# Patient Record
Sex: Female | Born: 1970 | Race: White | Hispanic: No | Marital: Single | Smoking: Never smoker
Health system: Southern US, Community
[De-identification: ages and names within clinical notes are randomized; demographics above are authoritative.]

## PROBLEM LIST (undated history)

## (undated) ENCOUNTER — Inpatient Hospital Stay (HOSPITAL_COMMUNITY): Payer: Self-pay

## (undated) DIAGNOSIS — I10 Essential (primary) hypertension: Secondary | ICD-10-CM

## (undated) HISTORY — PX: NO PAST SURGERIES: SHX2092

---

## 2015-10-22 NOTE — L&D Delivery Note (Signed)
Delivery Note At 7:16 PM a viable female was delivered via Vaginal, Spontaneous Delivery (Presentation: occiput posterior  ;  ).  APGAR: 7, 9; weight 8 lb 8.5 oz (3870 g).   Placenta status:  Complete , 3 vessel .  Cord:  with the following complications: .Nuchal cord x 1 reduced   Cord pH: Pending  Anesthesia: epidural  Episiotomy: None Lacerations: 2nd degree;Perineal Suture Repair: 3.0 vicryl Est. Blood Loss (mL): 350  Mom to postpartum.  Baby to Couplet care / Skin to Skin.  Datha Kissinger J. 07/04/2016, 7:48 PM

## 2015-11-24 LAB — OB RESULTS CONSOLE ABO/RH: RH TYPE: NEGATIVE

## 2015-11-24 LAB — OB RESULTS CONSOLE RUBELLA ANTIBODY, IGM: RUBELLA: IMMUNE

## 2015-11-24 LAB — OB RESULTS CONSOLE HEPATITIS B SURFACE ANTIGEN: Hepatitis B Surface Ag: NEGATIVE

## 2015-11-24 LAB — OB RESULTS CONSOLE RPR: RPR: NONREACTIVE

## 2015-11-24 LAB — OB RESULTS CONSOLE HIV ANTIBODY (ROUTINE TESTING): HIV: NONREACTIVE

## 2015-12-06 ENCOUNTER — Other Ambulatory Visit (HOSPITAL_COMMUNITY)
Admission: RE | Admit: 2015-12-06 | Discharge: 2015-12-06 | Disposition: A | Discharge status: Home / Self Care | Payer: BLUE CROSS/BLUE SHIELD | Source: Ambulatory Visit | Attending: Obstetrics and Gynecology | Admitting: Obstetrics and Gynecology

## 2015-12-06 DIAGNOSIS — Z1151 Encounter for screening for human papillomavirus (HPV): Secondary | ICD-10-CM | POA: Insufficient documentation

## 2015-12-06 DIAGNOSIS — Z01419 Encounter for gynecological examination (general) (routine) without abnormal findings: Secondary | ICD-10-CM | POA: Insufficient documentation

## 2015-12-06 DIAGNOSIS — Z113 Encounter for screening for infections with a predominantly sexual mode of transmission: Secondary | ICD-10-CM | POA: Insufficient documentation

## 2015-12-06 LAB — OB RESULTS CONSOLE GC/CHLAMYDIA
CHLAMYDIA, DNA PROBE: NEGATIVE
GC PROBE AMP, GENITAL: NEGATIVE

## 2016-04-30 ENCOUNTER — Inpatient Hospital Stay (HOSPITAL_COMMUNITY): Payer: BLUE CROSS/BLUE SHIELD

## 2016-04-30 ENCOUNTER — Inpatient Hospital Stay (HOSPITAL_COMMUNITY)
Admission: AD | Admit: 2016-04-30 | Discharge: 2016-04-30 | Disposition: A | Discharge status: Home / Self Care | Payer: BLUE CROSS/BLUE SHIELD | Source: Ambulatory Visit | Attending: Obstetrics and Gynecology | Admitting: Obstetrics and Gynecology

## 2016-04-30 ENCOUNTER — Encounter (HOSPITAL_COMMUNITY): Payer: Self-pay | Admitting: *Deleted

## 2016-04-30 DIAGNOSIS — O26893 Other specified pregnancy related conditions, third trimester: Secondary | ICD-10-CM | POA: Insufficient documentation

## 2016-04-30 DIAGNOSIS — O09529 Supervision of elderly multigravida, unspecified trimester: Secondary | ICD-10-CM

## 2016-04-30 DIAGNOSIS — Z6791 Unspecified blood type, Rh negative: Secondary | ICD-10-CM | POA: Insufficient documentation

## 2016-04-30 DIAGNOSIS — K219 Gastro-esophageal reflux disease without esophagitis: Secondary | ICD-10-CM | POA: Insufficient documentation

## 2016-04-30 DIAGNOSIS — Z3A29 29 weeks gestation of pregnancy: Secondary | ICD-10-CM | POA: Diagnosis not present

## 2016-04-30 DIAGNOSIS — O99613 Diseases of the digestive system complicating pregnancy, third trimester: Secondary | ICD-10-CM | POA: Diagnosis not present

## 2016-04-30 DIAGNOSIS — O26899 Other specified pregnancy related conditions, unspecified trimester: Secondary | ICD-10-CM | POA: Diagnosis present

## 2016-04-30 DIAGNOSIS — Y92481 Parking lot as the place of occurrence of the external cause: Secondary | ICD-10-CM | POA: Insufficient documentation

## 2016-04-30 DIAGNOSIS — IMO0001 Reserved for inherently not codable concepts without codable children: Secondary | ICD-10-CM | POA: Diagnosis not present

## 2016-04-30 DIAGNOSIS — Z8759 Personal history of other complications of pregnancy, childbirth and the puerperium: Secondary | ICD-10-CM

## 2016-04-30 LAB — CBC
HCT: 29.7 % — ABNORMAL LOW (ref 36.0–46.0)
Hemoglobin: 10.2 g/dL — ABNORMAL LOW (ref 12.0–15.0)
MCH: 29.1 pg (ref 26.0–34.0)
MCHC: 34.3 g/dL (ref 30.0–36.0)
MCV: 84.9 fL (ref 78.0–100.0)
PLATELETS: 244 10*3/uL (ref 150–400)
RBC: 3.5 MIL/uL — AB (ref 3.87–5.11)
RDW: 13.6 % (ref 11.5–15.5)
WBC: 14 10*3/uL — ABNORMAL HIGH (ref 4.0–10.5)

## 2016-04-30 LAB — KLEIHAUER-BETKE STAIN
# VIALS RHIG: 1
FETAL CELLS %: 0 %
QUANTITATION FETAL HEMOGLOBIN: 0 mL

## 2016-04-30 LAB — URINE MICROSCOPIC-ADD ON

## 2016-04-30 LAB — URINALYSIS, ROUTINE W REFLEX MICROSCOPIC
Bilirubin Urine: NEGATIVE
Glucose, UA: NEGATIVE mg/dL
HGB URINE DIPSTICK: NEGATIVE
Ketones, ur: NEGATIVE mg/dL
NITRITE: NEGATIVE
PROTEIN: NEGATIVE mg/dL
pH: 6 (ref 5.0–8.0)

## 2016-04-30 LAB — TYPE AND SCREEN
ABO/RH(D): AB NEG
Antibody Screen: NEGATIVE

## 2016-04-30 LAB — FIBRINOGEN: FIBRINOGEN: 652 mg/dL — AB (ref 204–475)

## 2016-04-30 LAB — PROTIME-INR
INR: 1.02 (ref 0.00–1.49)
Prothrombin Time: 13.6 seconds (ref 11.6–15.2)

## 2016-04-30 LAB — APTT: APTT: 25 s (ref 24–37)

## 2016-04-30 MED ORDER — RHO D IMMUNE GLOBULIN 1500 UNIT/2ML IJ SOSY
300.0000 ug | PREFILLED_SYRINGE | Freq: Once | INTRAMUSCULAR | Status: AC
  Administered 2016-04-30: 300 ug via INTRAMUSCULAR
  Filled 2016-04-30: qty 2

## 2016-04-30 NOTE — Discharge Instructions (Signed)
Call for any bleeding, decreased fetal movement, leaking of fluid, or any significant pain.  Fetal Movement Counts Performing a fetal movement count is highly recommended in high-risk pregnancies, but it is good for every pregnant woman to do. Your health care provider may ask you to start counting fetal movements at 28 weeks of the pregnancy. Fetal movements often increase:  After eating a full meal.  After physical activity.  After eating or drinking something sweet or cold.  At rest. Pay attention to when you feel the baby is most active. This will help you notice a pattern of your baby's sleep and wake cycles and what factors contribute to an increase in fetal movement. It is important to perform a fetal movement count at the same time each day when your baby is normally most active.  HOW TO COUNT FETAL MOVEMENTS 1. Find a quiet and comfortable area to sit or lie down on your left side. Lying on your left side provides the best blood and oxygen circulation to your baby. 2. Write down the day and time on a sheet of paper or in a journal. 3. Start counting kicks, flutters, swishes, rolls, or jabs in a 2-hour period. You should feel at least 10 movements within 2 hours. 4. If you do not feel 10 movements in 2 hours, wait 2-3 hours and count again. Look for a change in the pattern or not enough counts in 2 hours. SEEK MEDICAL CARE IF:  You feel less than 10 counts in 2 hours, tried twice.  There is no movement in over an hour.  The pattern is changing or taking longer each day to reach 10 counts in 2 hours.  You feel the baby is not moving as he or she usually does.

## 2016-04-30 NOTE — MAU Note (Signed)
Urine in lab 
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
## 2016-05-01 LAB — RH IG WORKUP (INCLUDES ABO/RH)
ABO/RH(D): AB NEG
Fetal Screen: NEGATIVE
GESTATIONAL AGE(WKS): 29
Unit division: 0

## 2016-06-18 LAB — OB RESULTS CONSOLE GBS: GBS: POSITIVE

## 2016-07-02 ENCOUNTER — Inpatient Hospital Stay (HOSPITAL_COMMUNITY)
Admission: AD | Admit: 2016-07-02 | Discharge: 2016-07-08 | DRG: 775 | Disposition: A | Discharge status: Home / Self Care | Payer: BLUE CROSS/BLUE SHIELD | Source: Ambulatory Visit | Attending: Obstetrics and Gynecology | Admitting: Obstetrics and Gynecology

## 2016-07-02 ENCOUNTER — Encounter (HOSPITAL_COMMUNITY): Payer: Self-pay | Admitting: *Deleted

## 2016-07-02 DIAGNOSIS — O9902 Anemia complicating childbirth: Secondary | ICD-10-CM | POA: Diagnosis present

## 2016-07-02 DIAGNOSIS — Z3A38 38 weeks gestation of pregnancy: Secondary | ICD-10-CM | POA: Diagnosis not present

## 2016-07-02 DIAGNOSIS — E669 Obesity, unspecified: Secondary | ICD-10-CM | POA: Diagnosis present

## 2016-07-02 DIAGNOSIS — O139 Gestational [pregnancy-induced] hypertension without significant proteinuria, unspecified trimester: Secondary | ICD-10-CM | POA: Diagnosis present

## 2016-07-02 DIAGNOSIS — O1494 Unspecified pre-eclampsia, complicating childbirth: Secondary | ICD-10-CM | POA: Diagnosis present

## 2016-07-02 DIAGNOSIS — Z6791 Unspecified blood type, Rh negative: Secondary | ICD-10-CM | POA: Diagnosis not present

## 2016-07-02 DIAGNOSIS — O99214 Obesity complicating childbirth: Secondary | ICD-10-CM | POA: Diagnosis present

## 2016-07-02 DIAGNOSIS — Z6838 Body mass index (BMI) 38.0-38.9, adult: Secondary | ICD-10-CM

## 2016-07-02 DIAGNOSIS — D649 Anemia, unspecified: Secondary | ICD-10-CM | POA: Diagnosis present

## 2016-07-02 DIAGNOSIS — O26893 Other specified pregnancy related conditions, third trimester: Secondary | ICD-10-CM | POA: Diagnosis present

## 2016-07-02 DIAGNOSIS — O3663X0 Maternal care for excessive fetal growth, third trimester, not applicable or unspecified: Secondary | ICD-10-CM | POA: Diagnosis present

## 2016-07-02 DIAGNOSIS — O99824 Streptococcus B carrier state complicating childbirth: Secondary | ICD-10-CM | POA: Diagnosis present

## 2016-07-02 DIAGNOSIS — O134 Gestational [pregnancy-induced] hypertension without significant proteinuria, complicating childbirth: Secondary | ICD-10-CM | POA: Diagnosis present

## 2016-07-02 DIAGNOSIS — O1490 Unspecified pre-eclampsia, unspecified trimester: Secondary | ICD-10-CM | POA: Diagnosis present

## 2016-07-02 HISTORY — DX: Essential (primary) hypertension: I10

## 2016-07-02 LAB — COMPREHENSIVE METABOLIC PANEL
ALBUMIN: 2.7 g/dL — AB (ref 3.5–5.0)
ALT: 13 U/L — AB (ref 14–54)
AST: 19 U/L (ref 15–41)
Alkaline Phosphatase: 97 U/L (ref 38–126)
Anion gap: 8 (ref 5–15)
BILIRUBIN TOTAL: 0.4 mg/dL (ref 0.3–1.2)
BUN: 8 mg/dL (ref 6–20)
CO2: 20 mmol/L — ABNORMAL LOW (ref 22–32)
CREATININE: 0.63 mg/dL (ref 0.44–1.00)
Calcium: 9 mg/dL (ref 8.9–10.3)
Chloride: 108 mmol/L (ref 101–111)
GFR calc Af Amer: >60 mL/min (ref >60)
GLUCOSE: 113 mg/dL — AB (ref 65–99)
POTASSIUM: 3.4 mmol/L — AB (ref 3.5–5.1)
Sodium: 136 mmol/L (ref 135–145)
TOTAL PROTEIN: 6.4 g/dL — AB (ref 6.5–8.1)

## 2016-07-02 LAB — URIC ACID: Uric Acid, Serum: 3.7 mg/dL (ref 2.3–6.6)

## 2016-07-02 LAB — PROTEIN / CREATININE RATIO, URINE
Creatinine, Urine: 64 mg/dL
PROTEIN CREATININE RATIO: 0.19 mg/mg{creat} — AB (ref 0.00–0.15)
Total Protein, Urine: 12 mg/dL

## 2016-07-02 LAB — TYPE AND SCREEN
ABO/RH(D): AB NEG
Antibody Screen: NEGATIVE

## 2016-07-02 LAB — CBC
HEMATOCRIT: 29.2 % — AB (ref 36.0–46.0)
HEMOGLOBIN: 9.9 g/dL — AB (ref 12.0–15.0)
MCH: 28.8 pg (ref 26.0–34.0)
MCHC: 33.9 g/dL (ref 30.0–36.0)
MCV: 84.9 fL (ref 78.0–100.0)
Platelets: 235 10*3/uL (ref 150–400)
RBC: 3.44 MIL/uL — AB (ref 3.87–5.11)
RDW: 14.5 % (ref 11.5–15.5)
WBC: 12.4 10*3/uL — AB (ref 4.0–10.5)

## 2016-07-02 LAB — LACTATE DEHYDROGENASE: LDH: 142 U/L (ref 98–192)

## 2016-07-02 MED ORDER — ZOLPIDEM TARTRATE 5 MG PO TABS: 5.0000 mg | ORAL_TABLET | Freq: Every evening | ORAL | Status: DC | PRN

## 2016-07-02 MED ORDER — PANTOPRAZOLE SODIUM 40 MG PO TBEC
40.0000 mg | DELAYED_RELEASE_TABLET | Freq: Every day | ORAL | Status: DC
  Administered 2016-07-03 – 2016-07-04 (×2): 40 mg via ORAL
  Filled 2016-07-02 (×2): qty 1

## 2016-07-02 MED ORDER — OXYCODONE-ACETAMINOPHEN 5-325 MG PO TABS: 1.0000 | ORAL_TABLET | ORAL | Status: DC | PRN

## 2016-07-02 MED ORDER — LACTATED RINGERS IV SOLN: 500.0000 mL | INTRAVENOUS | Status: DC | PRN

## 2016-07-02 MED ORDER — ACETAMINOPHEN 325 MG PO TABS
650.0000 mg | ORAL_TABLET | ORAL | Status: DC | PRN
  Administered 2016-07-03 – 2016-07-04 (×2): 650 mg via ORAL
  Filled 2016-07-02 (×2): qty 2

## 2016-07-02 MED ORDER — LABETALOL HCL 5 MG/ML IV SOLN
20.0000 mg | INTRAVENOUS | Status: AC | PRN
  Administered 2016-07-03 (×3): 20 mg via INTRAVENOUS
  Filled 2016-07-02 (×3): qty 4

## 2016-07-02 MED ORDER — LACTATED RINGERS IV SOLN
INTRAVENOUS | Status: DC
  Administered 2016-07-02 – 2016-07-04 (×4): via INTRAVENOUS

## 2016-07-02 MED ORDER — MISOPROSTOL 25 MCG QUARTER TABLET
25.0000 ug | ORAL_TABLET | ORAL | Status: DC | PRN
  Administered 2016-07-02 (×2): 25 ug via VAGINAL
  Filled 2016-07-02 (×2): qty 0.25
  Filled 2016-07-02: qty 1

## 2016-07-02 MED ORDER — OXYTOCIN 40 UNITS IN LACTATED RINGERS INFUSION - SIMPLE MED
2.5000 [IU]/h | INTRAVENOUS | Status: DC
Start: 2016-07-02 — End: 2016-07-04
  Administered 2016-07-04: 2.5 [IU]/h via INTRAVENOUS
  Filled 2016-07-02 (×2): qty 1000

## 2016-07-02 MED ORDER — OXYCODONE-ACETAMINOPHEN 5-325 MG PO TABS: 2.0000 | ORAL_TABLET | ORAL | Status: DC | PRN

## 2016-07-02 MED ORDER — PENICILLIN G POTASSIUM 5000000 UNITS IJ SOLR
2.5000 10*6.[IU] | INTRAVENOUS | Status: DC
  Administered 2016-07-02 – 2016-07-04 (×11): 2.5 10*6.[IU] via INTRAVENOUS
  Filled 2016-07-02 (×14): qty 2.5

## 2016-07-02 MED ORDER — HYDRALAZINE HCL 20 MG/ML IJ SOLN: 10.0000 mg | Freq: Once | INTRAMUSCULAR | Status: DC | PRN

## 2016-07-02 MED ORDER — FENTANYL CITRATE (PF) 100 MCG/2ML IJ SOLN
50.0000 ug | INTRAMUSCULAR | Status: DC | PRN
  Administered 2016-07-03: 100 ug via INTRAVENOUS
  Filled 2016-07-02: qty 2

## 2016-07-02 MED ORDER — OXYTOCIN BOLUS FROM INFUSION
500.0000 mL | Freq: Once | INTRAVENOUS | Status: AC
  Administered 2016-07-04: 500 mL via INTRAVENOUS

## 2016-07-02 MED ORDER — TERBUTALINE SULFATE 1 MG/ML IJ SOLN
0.2500 mg | Freq: Once | INTRAMUSCULAR | Status: DC | PRN
  Filled 2016-07-02: qty 1

## 2016-07-02 MED ORDER — ONDANSETRON HCL 4 MG/2ML IJ SOLN
4.0000 mg | Freq: Four times a day (QID) | INTRAMUSCULAR | Status: DC | PRN
  Administered 2016-07-03: 4 mg via INTRAVENOUS
  Filled 2016-07-02: qty 2

## 2016-07-02 MED ORDER — LIDOCAINE HCL (PF) 1 % IJ SOLN
30.0000 mL | INTRAMUSCULAR | Status: DC | PRN
  Filled 2016-07-02: qty 30

## 2016-07-02 MED ORDER — HYDROXYZINE HCL 50 MG PO TABS
50.0000 mg | ORAL_TABLET | Freq: Four times a day (QID) | ORAL | Status: DC | PRN
  Filled 2016-07-02: qty 1

## 2016-07-02 MED ORDER — SOD CITRATE-CITRIC ACID 500-334 MG/5ML PO SOLN: 30.0000 mL | ORAL | Status: DC | PRN

## 2016-07-02 MED ORDER — PENICILLIN G POTASSIUM 5000000 UNITS IJ SOLR
5.0000 10*6.[IU] | Freq: Once | INTRAMUSCULAR | Status: AC
  Administered 2016-07-02: 5 10*6.[IU] via INTRAVENOUS
  Filled 2016-07-02: qty 5

## 2016-07-02 NOTE — Progress Notes (Signed)
Subjective: Here for induction due to gestational hypertension.  Denies HA, visual sx, or epigastric pain.  Aware of plan for me to manage her during the night for Dr. Richardson Doppole, and accepting of it.  Objective: BP (!) 163/91   Pulse 85   Resp 18   Ht 5\' 7"  (1.702 m)   Wt 110.7 kg (244 lb)   BMI 38.22 kg/m  No intake/output data recorded. No intake/output data recorded.   Vitals:   07/02/16 1834 07/02/16 1903  BP:  (!) 163/91  Pulse:  85  Resp:  18  Weight: 110.7 kg (244 lb)   Height: 5\' 7"  (1.702 m)    Initial PCN dose for GBS infusing.  FHT: Category 1 UC:   Occasional, mild SVE:   Dilation: Closed Effacement (%): Thick Station: Ballotable Exam by:: Davis,RN at 7pm Cytotech 25 mcg #1 dose placed by RN  Results for orders placed or performed during the hospital encounter of 07/02/16 (from the past 24 hour(s))  Type and screen [facility]     Status: None   Collection Time: 07/02/16  6:45 PM  Result Value Ref Range   ABO/RH(D) AB NEG    Antibody Screen NEG    Sample Expiration 07/05/2016   CBC     Status: Abnormal   Collection Time: 07/02/16  6:45 PM  Result Value Ref Range   WBC 12.4 (H) 4.0 - 10.5 K/uL   RBC 3.44 (L) 3.87 - 5.11 MIL/uL   Hemoglobin 9.9 (L) 12.0 - 15.0 g/dL   HCT 53.629.2 (L) 64.436.0 - 03.446.0 %   MCV 84.9 78.0 - 100.0 fL   MCH 28.8 26.0 - 34.0 pg   MCHC 33.9 30.0 - 36.0 g/dL   RDW 74.214.5 59.511.5 - 63.815.5 %   Platelets 235 150 - 400 K/uL  Comprehensive metabolic panel     Status: Abnormal   Collection Time: 07/02/16  6:45 PM  Result Value Ref Range   Sodium 136 135 - 145 mmol/L   Potassium 3.4 (L) 3.5 - 5.1 mmol/L   Chloride 108 101 - 111 mmol/L   CO2 20 (L) 22 - 32 mmol/L   Glucose, Bld 113 (H) 65 - 99 mg/dL   BUN 8 6 - 20 mg/dL   Creatinine, Ser 7.560.63 0.44 - 1.00 mg/dL   Calcium 9.0 8.9 - 43.310.3 mg/dL   Total Protein 6.4 (L) 6.5 - 8.1 g/dL   Albumin 2.7 (L) 3.5 - 5.0 g/dL   AST 19 15 - 41 U/L   ALT 13 (L) 14 - 54 U/L   Alkaline  Phosphatase 97 38 - 126 U/L   Total Bilirubin 0.4 0.3 - 1.2 mg/dL   GFR calc non Af Amer >60 >60 mL/min   GFR calc Af Amer >60 >60 mL/min   Anion gap 8 5 - 15  Lactate dehydrogenase     Status: None   Collection Time: 07/02/16  6:45 PM  Result Value Ref Range   LDH 142 98 - 192 U/L  Uric acid     Status: None   Collection Time: 07/02/16  6:45 PM  Result Value Ref Range   Uric Acid, Serum 3.7 2.3 - 6.6 mg/dL  Protein / creatinine ratio, urine     Status: Abnormal   Collection Time: 07/02/16  6:55 PM  Result Value Ref Range   Creatinine, Urine 64.00 mg/dL   Total Protein, Urine 12 mg/dL   Protein Creatinine Ratio 0.19 (H) 0.00 - 0.15 mg/mg[Cre]  Assessment:  IUP at 38 week--induction for gestational hypertension Rh negative GBS positive LGA fetus, proven to 8+6 Does not plan epidural  Plan: Will be observing patient for Dr. Richardson Dopp overnight.  Patient is agreeable with plan. Continue Cytotech for cervical ripening. OK for light meal. IV antihypertensives for BP parameters.  [name] CNM 07/02/2016, 7:46 PM

## 2016-07-02 NOTE — Anesthesia Pain Management Evaluation Note (Signed)
  CRNA Pain Management Visit Note  Patient: Elwyn LadeChristine Mcilrath, 45 y.o., female  "Hello [facility] am a [facility]. We have an anesthesia team available at all times to provide care throughout the hospital, including epidural management and anesthesia for C-section. I don't know your plan for the delivery whether it a natural birth, water birth, IV sedation, nitrous supplementation, doula or epidural, but we want to meet your pain goals."   1.Was your pain managed to your expectations on prior hospitalizations?   Yes   2.What is your expectation for pain management during this hospitalization?     Labor support without medications  3.How can we help you reach that goal? Standby  Record the patient's initial score and the patient's pain goal.   Pain: 0  Pain Goal: 10 [facility] wants you to be able to say your pain was always managed very well.  Meridee Branum 07/02/2016

## 2016-07-02 NOTE — H&P (Signed)
Carmen Baker is a 45 y.o.  G3P2002 at 38 wks and 0 days based on LMP 10/10/2015 confirmed by 8 wk ultrasound with EDD 07/16/2016. Pregnancy has been complicated by Advanced maternal Age. Prenatal care provided by Dr. Gerald Leitz with [facility]. Pt presented to the office today for routine visit and bp was 157/94 repeat was 158/99. Trace protein on urine dip. She denies headache, visual disturbances or ruq pain. Cervix is 1/50/-3 on exam. No ctx/ no vaginal bleeding no Leakage of fluid. She is admitted for induction due to gestational hypertension.   EFW on ultrasound today in the office 07/02/2016 is 8 lbs 7 oz.    OB History    Gravida Para Term Preterm AB Living   3         2   SAB TAB Ectopic Multiple Live Births           2      PMH None  PSH None   Family History noncontributory.   Social History:  reports that she has never smoked. She does not have any smokeless tobacco history on file. She reports that she does not drink alcohol or use drugs.     Maternal Diabetes: No Genetic Screening: Normal Maternal Ultrasounds/Referrals: Normal Fetal Ultrasounds or other Referrals:  None Maternal Substance Abuse:  No Significant Maternal Medications:  None Significant Maternal Lab Results:  Lab values include: Group B Strep positive Other Comments:  None  Review of Systems  Constitutional: Negative.   Eyes: Negative for blurred vision, double vision and photophobia.  Respiratory: Negative.   Cardiovascular: Negative.   Gastrointestinal: Negative.   Genitourinary: Negative.   Musculoskeletal: Negative.   Skin: Positive for rash.  Neurological: Negative.  Negative for headaches.  Endo/Heme/Allergies: Negative.   Psychiatric/Behavioral: Negative.    Maternal Medical History:  Fetal activity: Perceived fetal activity is normal.   Last perceived fetal movement was within the past hour.        There were no vitals taken for this visit. Maternal Exam:  Abdomen: Patient  reports no abdominal tenderness. Estimated fetal weight is 8 lbs 7oz on ultrasound today 07/02/2016.. in the office.   Fetal presentation: vertex  Introitus: Normal vulva. Vagina is positive for cysts.  Vagina is negative for discharge.  Pelvis: adequate for delivery.   Cervix: Cervix evaluated by digital exam.     Fetal Exam Fetal Monitor Review: Baseline rate: 140 reactive NST in the office .  Variability: moderate (6-25 bpm).   Pattern: accelerations present and no decelerations.    Fetal State Assessment: Category I - tracings are normal.     Physical Exam  Constitutional: She is oriented to person, place, and time. She appears well-developed and well-nourished.  HENT:  Head: Normocephalic and atraumatic.  Eyes: Pupils are equal, round, and reactive to light.  Neck: Normal range of motion.  Cardiovascular: Normal rate and regular rhythm.   Respiratory: Effort normal and breath sounds normal.  GI: Bowel sounds are normal. There is no tenderness.  Genitourinary: Uterus normal. No vaginal discharge found.  Genitourinary Comments: 4 cm Right vaginal cyst   Musculoskeletal: Normal range of motion. She exhibits edema.  Neurological: She is alert and oriented to person, place, and time. She has normal reflexes.  Skin: Skin is warm and dry.  Psychiatric: She has a normal mood and affect.    Prenatal labs: ABO, Rh: --/--/AB NEG, AB NEG (07/11 1828) Antibody: NEG (07/11 1828) Rubella:   Immune  RPR:  Nonreactive  HBsAg:  Negative   HIV:   Negative  GBS:   Positive   Assessment/Plan: 38 wks and 0 days with gestational hypertension admit for cytotec induction Labs to rule out Preeclampsia. Ordered GBS positive- penicillin LGA fetus. Pelvis proven to 8 lbs 6 oz  .Marland Kitchen. Anticipate SVD    Carmen Baker J. 07/02/2016, 6:03 PM

## 2016-07-03 ENCOUNTER — Inpatient Hospital Stay (HOSPITAL_COMMUNITY): Payer: BLUE CROSS/BLUE SHIELD | Admitting: Anesthesiology

## 2016-07-03 LAB — CBC
HCT: 31.4 % — ABNORMAL LOW (ref 36.0–46.0)
HEMOGLOBIN: 10.7 g/dL — AB (ref 12.0–15.0)
MCH: 29 pg (ref 26.0–34.0)
MCHC: 34.1 g/dL (ref 30.0–36.0)
MCV: 85.1 fL (ref 78.0–100.0)
Platelets: 245 10*3/uL (ref 150–400)
RBC: 3.69 MIL/uL — AB (ref 3.87–5.11)
RDW: 14.3 % (ref 11.5–15.5)
WBC: 17.8 10*3/uL — ABNORMAL HIGH (ref 4.0–10.5)

## 2016-07-03 LAB — RPR: RPR Ser Ql: NONREACTIVE

## 2016-07-03 MED ORDER — EPHEDRINE 5 MG/ML INJ
10.0000 mg | INTRAVENOUS | Status: DC | PRN
  Filled 2016-07-03: qty 4

## 2016-07-03 MED ORDER — PHENYLEPHRINE 40 MCG/ML (10ML) SYRINGE FOR IV PUSH (FOR BLOOD PRESSURE SUPPORT)
80.0000 ug | PREFILLED_SYRINGE | INTRAVENOUS | Status: DC | PRN
  Filled 2016-07-03: qty 5
  Filled 2016-07-03: qty 10

## 2016-07-03 MED ORDER — OXYTOCIN 40 UNITS IN LACTATED RINGERS INFUSION - SIMPLE MED
1.0000 m[IU]/min | INTRAVENOUS | Status: DC
  Administered 2016-07-03 – 2016-07-04 (×2): 1 m[IU]/min via INTRAVENOUS

## 2016-07-03 MED ORDER — TRIAMCINOLONE ACETONIDE 0.5 % EX CREA
TOPICAL_CREAM | Freq: Two times a day (BID) | CUTANEOUS | Status: DC | PRN
Start: 2016-07-03 — End: 2016-07-04
  Administered 2016-07-03: 1 via TOPICAL

## 2016-07-03 MED ORDER — LIDOCAINE HCL (PF) 1 % IJ SOLN
INTRAMUSCULAR | Status: DC | PRN
  Administered 2016-07-03 (×2): 4 mL

## 2016-07-03 MED ORDER — FENTANYL 2.5 MCG/ML BUPIVACAINE 1/10 % EPIDURAL INFUSION (WH - ANES)
14.0000 mL/h | INTRAMUSCULAR | Status: DC | PRN
  Administered 2016-07-03 – 2016-07-04 (×3): 14 mL/h via EPIDURAL
  Filled 2016-07-03 (×3): qty 125

## 2016-07-03 MED ORDER — PHENYLEPHRINE 40 MCG/ML (10ML) SYRINGE FOR IV PUSH (FOR BLOOD PRESSURE SUPPORT)
80.0000 ug | PREFILLED_SYRINGE | INTRAVENOUS | Status: DC | PRN
  Filled 2016-07-03: qty 5

## 2016-07-03 MED ORDER — DIPHENHYDRAMINE HCL 50 MG/ML IJ SOLN
12.5000 mg | INTRAMUSCULAR | Status: DC | PRN
  Administered 2016-07-04: 12.5 mg via INTRAVENOUS
  Filled 2016-07-03: qty 1

## 2016-07-03 MED ORDER — LACTATED RINGERS IV SOLN: 500.0000 mL | Freq: Once | INTRAVENOUS | Status: DC

## 2016-07-03 NOTE — Care Management (Signed)
Requested charge nurse to have another nurse care for patient while in delivery of another patient from 0840-1200 07/03/16.

## 2016-07-03 NOTE — Progress Notes (Signed)
Carmen Baker is a 45 y.o. G3P2002 at 691w1d  admitted for induction of labor due to gestational hypertension.  Subjective: Pt reports contractions are more painful. Feels pressure with contractions  Objective: BP 136/62   Pulse 74   Temp 98.3 F (36.8 C) (Oral)   Resp 20   Ht 5\' 7"  (1.702 m)   Wt 244 lb (110.7 kg)   BMI 38.22 kg/m  No intake/output data recorded. No intake/output data recorded.  FHT:  FHR: 130 bpm, variability: moderate,  accelerations:  Present,  decelerations:  Absent UC:   regular, every 2 minutes SVE:   Dilation: 4 Effacement (%): 50 Station: -3 Exam by:: Richardson Doppole, MD  Labs: Lab Results  Component Value Date   WBC 12.4 (H) 07/02/2016   HGB 9.9 (L) 07/02/2016   HCT 29.2 (L) 07/02/2016   MCV 84.9 07/02/2016   PLT 235 07/02/2016    Assessment / Plan: induction of labor due to gestational hypertension   Labor: Progressing on Pitocin, will continue to increase then AROM Preeclampsia:  NA Fetal Wellbeing:  Category I Pain Control:  Labor support without medications I/D:  Penicillin Anticipated MOD:  NSVD  Dr. Charlotta Newtonzan covering now  And until 7 am in the morning   Carmen Baker. 07/03/2016, 6:12 PM

## 2016-07-03 NOTE — Progress Notes (Signed)
  Subjective: Aware of more cramping, 3/10 intensity.  Denies need for pain med.  Objective: BP (!) 155/87   Pulse 76   Temp 98.3 F (36.8 C) (Oral)   Resp 20   Ht 5\' 7"  (1.702 m)   Wt 110.7 kg (244 lb)   BMI 38.22 kg/m  No intake/output data recorded. No intake/output data recorded.   Vitals:   07/03/16 0000 07/03/16 0100 07/03/16 0155 07/03/16 0324  BP: (!) 147/74   (!) 155/87  Pulse: 73   76  Resp:  20 18 20   Temp:      TempSrc:      Weight:      Height:        FHT: Category 1--occasional mild variables, moderate variability UC:   irregular, every 2-6 minutes SVE:   Dilation: 1.5 Effacement (%): 60 Station: -3 Exam by:: Corey Skainsatherine stout RN Received 2 doses Cytotech, last at 2321  Assessment:  Induction for gestational hypertension GBS positive Anemia--Hgb 9.9  Plan: Start pitocin per low dose protocol, increase to 4 mu/min, and hold there.  Nigel BridgemanLATHAM, Catrice Zuleta CNM 07/03/2016, 3:35 AM

## 2016-07-03 NOTE — Anesthesia Preprocedure Evaluation (Signed)
Anesthesia Evaluation  Patient identified by MRN, date of birth, ID band Patient awake    Reviewed: Allergy & Precautions, NPO status , Patient's Chart, lab work & pertinent test results  History of Anesthesia Complications Negative for: history of anesthetic complications  Airway Mallampati: II  TM Distance: >3 FB Neck ROM: Full    Dental no notable dental hx. (+) Dental Advisory Given   Pulmonary neg pulmonary ROS,    Pulmonary exam normal breath sounds clear to auscultation       Cardiovascular hypertension, Normal cardiovascular exam Rhythm:Regular Rate:Normal     Neuro/Psych negative neurological ROS  negative psych ROS   GI/Hepatic negative GI ROS, Neg liver ROS,   Endo/Other  obesity  Renal/GU negative Renal ROS  negative genitourinary   Musculoskeletal negative musculoskeletal ROS (+)   Abdominal   Peds negative pediatric ROS (+)  Hematology negative hematology ROS (+)   Anesthesia Other Findings   Reproductive/Obstetrics (+) Pregnancy                            Anesthesia Physical Anesthesia Plan  ASA: II  Anesthesia Plan: Epidural   Post-op Pain Management:    Induction:   Airway Management Planned:   Additional Equipment:   Intra-op Plan:   Post-operative Plan:   Informed Consent: I have reviewed the patients History and Physical, chart, labs and discussed the procedure including the risks, benefits and alternatives for the proposed anesthesia with the patient or authorized representative who has indicated his/her understanding and acceptance.   Dental advisory given  Plan Discussed with: CRNA  Anesthesia Plan Comments:         Anesthesia Quick Evaluation  

## 2016-07-03 NOTE — Anesthesia Procedure Notes (Signed)
Epidural Patient location during procedure: OB  Staffing Anesthesiologist: Haris Baack Performed: anesthesiologist   Preanesthetic Checklist Completed: patient identified, site marked, surgical consent, pre-op evaluation, timeout performed, IV checked, risks and benefits discussed and monitors and equipment checked  Epidural Patient position: sitting Prep: site prepped and draped and DuraPrep Patient monitoring: continuous pulse ox and blood pressure Approach: midline Location: L3-L4 Injection technique: LOR saline  Needle:  Needle type: Tuohy  Needle gauge: 17 G Needle length: 9 cm and 9 Needle insertion depth: 5 cm cm Catheter type: closed end flexible Catheter size: 19 Gauge Catheter at skin depth: 10 cm Test dose: negative  Assessment Events: blood not aspirated, injection not painful, no injection resistance, negative IV test and no paresthesia  Additional Notes Patient identified. Risks/Benefits/Options discussed with patient including but not limited to bleeding, infection, nerve damage, paralysis, failed block, incomplete pain control, headache, blood pressure changes, nausea, vomiting, reactions to medication both or allergic, itching and postpartum back pain. Confirmed with bedside nurse the patient's most recent platelet count. Confirmed with patient that they are not currently taking any anticoagulation, have any bleeding history or any family history of bleeding disorders. Patient expressed understanding and wished to proceed. All questions were answered. Sterile technique was used throughout the entire procedure. Please see nursing notes for vital signs. Test dose was given through epidural catheter and negative prior to continuing to dose epidural or start infusion. Warning signs of high block given to the patient including shortness of breath, tingling/numbness in hands, complete motor block, or any concerning symptoms with instructions to call for help. Patient was  given instructions on fall risk and not to get out of bed. All questions and concerns addressed with instructions to call with any issues or inadequate analgesia.        

## 2016-07-03 NOTE — Progress Notes (Signed)
Elwyn LadeChristine Seliga is a 45 y.o. G3P2002 at 1823w1d  admitted for induction of labor due to gestational hypertension.  Subjective: Pt rates contractions 3 out 10.   Objective: BP 128/66   Pulse 76   Temp 98.3 F (36.8 C) (Oral)   Resp 20   Ht 5\' 7"  (1.702 m)   Wt 244 lb (110.7 kg)   BMI 38.22 kg/m  No intake/output data recorded. No intake/output data recorded.  FHT:  Category 1 UC:   regular, every 3 minutes SVE:   Dilation: 1.5 Effacement (%): 50 Station: -3 Exam by:: Richardson Doppole, MD  Labs: Lab Results  Component Value Date   WBC 12.4 (H) 07/02/2016   HGB 9.9 (L) 07/02/2016   HCT 29.2 (L) 07/02/2016   MCV 84.9 07/02/2016   PLT 235 07/02/2016    Assessment / Plan: Induction of labor due to gestational hypertension  Labor: plan to increase pitocin 2x2 . Fetal Wellbeing:  Category I Pain Control:  Labor support without medications I/D:  Penicillin Anticipated MOD:  NSVD  Keenan Trefry J. 07/03/2016, 8:32 AM

## 2016-07-04 ENCOUNTER — Encounter (HOSPITAL_COMMUNITY): Payer: Self-pay | Admitting: *Deleted

## 2016-07-04 MED ORDER — OXYTOCIN 40 UNITS IN LACTATED RINGERS INFUSION - SIMPLE MED
1.0000 m[IU]/min | INTRAVENOUS | Status: DC
  Administered 2016-07-04: 18 m[IU]/min via INTRAVENOUS

## 2016-07-04 MED ORDER — DIPHENHYDRAMINE HCL 25 MG PO CAPS: 25.0000 mg | ORAL_CAPSULE | Freq: Four times a day (QID) | ORAL | Status: DC | PRN

## 2016-07-04 MED ORDER — MISOPROSTOL 200 MCG PO TABS
ORAL_TABLET | ORAL | Status: AC
  Filled 2016-07-04: qty 5

## 2016-07-04 MED ORDER — DIBUCAINE 1 % RE OINT
1.0000 "application " | TOPICAL_OINTMENT | RECTAL | Status: DC | PRN
  Administered 2016-07-05: 1 via RECTAL
  Filled 2016-07-04: qty 28

## 2016-07-04 MED ORDER — ONDANSETRON HCL 4 MG/2ML IJ SOLN: 4.0000 mg | INTRAMUSCULAR | Status: DC | PRN

## 2016-07-04 MED ORDER — ACETAMINOPHEN 325 MG PO TABS: 650.0000 mg | ORAL_TABLET | ORAL | Status: DC | PRN

## 2016-07-04 MED ORDER — FERROUS SULFATE 325 (65 FE) MG PO TABS
325.0000 mg | ORAL_TABLET | Freq: Two times a day (BID) | ORAL | Status: DC
Start: 2016-07-05 — End: 2016-07-05
  Administered 2016-07-05 (×2): 325 mg via ORAL
  Filled 2016-07-04 (×2): qty 1

## 2016-07-04 MED ORDER — MAGNESIUM HYDROXIDE 400 MG/5ML PO SUSP: 30.0000 mL | ORAL | Status: DC | PRN

## 2016-07-04 MED ORDER — SENNOSIDES-DOCUSATE SODIUM 8.6-50 MG PO TABS
2.0000 | ORAL_TABLET | ORAL | Status: DC
  Administered 2016-07-05: 2 via ORAL
  Filled 2016-07-04: qty 2

## 2016-07-04 MED ORDER — COCONUT OIL OIL: 1.0000 "application " | TOPICAL_OIL | Status: DC | PRN

## 2016-07-04 MED ORDER — SIMETHICONE 80 MG PO CHEW: 80.0000 mg | CHEWABLE_TABLET | ORAL | Status: DC | PRN

## 2016-07-04 MED ORDER — OXYCODONE HCL 5 MG PO TABS: 10.0000 mg | ORAL_TABLET | ORAL | Status: DC | PRN

## 2016-07-04 MED ORDER — ZOLPIDEM TARTRATE 5 MG PO TABS: 5.0000 mg | ORAL_TABLET | Freq: Every evening | ORAL | Status: DC | PRN

## 2016-07-04 MED ORDER — BENZOCAINE-MENTHOL 20-0.5 % EX AERO
1.0000 | INHALATION_SPRAY | CUTANEOUS | Status: DC | PRN
Start: 2016-07-04 — End: 2016-07-05
  Administered 2016-07-05: 1 via TOPICAL
  Filled 2016-07-04: qty 56

## 2016-07-04 MED ORDER — OXYCODONE HCL 5 MG PO TABS
5.0000 mg | ORAL_TABLET | ORAL | Status: DC | PRN
  Administered 2016-07-05: 5 mg via ORAL
  Filled 2016-07-04: qty 1

## 2016-07-04 MED ORDER — ONDANSETRON HCL 4 MG PO TABS: 4.0000 mg | ORAL_TABLET | ORAL | Status: DC | PRN

## 2016-07-04 MED ORDER — PRENATAL MULTIVITAMIN CH
1.0000 | ORAL_TABLET | Freq: Every day | ORAL | Status: DC
  Administered 2016-07-05: 1 via ORAL
  Filled 2016-07-04: qty 1

## 2016-07-04 MED ORDER — IBUPROFEN 600 MG PO TABS
600.0000 mg | ORAL_TABLET | Freq: Four times a day (QID) | ORAL | Status: DC
Start: 2016-07-05 — End: 2016-07-05
  Administered 2016-07-05 (×4): 600 mg via ORAL
  Filled 2016-07-04 (×4): qty 1

## 2016-07-04 MED ORDER — WITCH HAZEL-GLYCERIN EX PADS: 1.0000 "application " | MEDICATED_PAD | CUTANEOUS | Status: DC | PRN

## 2016-07-04 MED ORDER — LACTATED RINGERS IV SOLN
INTRAVENOUS | Status: DC
  Administered 2016-07-04: 13:00:00 via INTRAUTERINE

## 2016-07-04 NOTE — Progress Notes (Signed)
Elwyn LadeChristine Hink is a 45 y.o. G3P2002 at 1175w2d admitted for induction of labor due to gestational hypertension .  Subjective: Pt is comfortable with her epidural   Objective: BP 136/66   Pulse 88   Temp 98.4 F (36.9 C) (Oral)   Resp 20   Ht 5\' 7"  (1.702 m)   Wt 244 lb (110.7 kg)   SpO2 98%   BMI 38.22 kg/m  No intake/output data recorded. Total I/O In: -  Out: 450 [Urine:450]  FHT:  FHR: 120 bpm, variability: moderate,  accelerations:  Present,  decelerations:  Present variable  UC:   regular, every 2 minutes mvu's only 75-100 SVE:   Dilation: 8 Effacement (%): 80 Station: -2 Exam by:: Herma CarsonLindsay Lima, rn  Labs: Lab Results  Component Value Date   WBC 17.8 (H) 07/03/2016   HGB 10.7 (L) 07/03/2016   HCT 31.4 (L) 07/03/2016   MCV 85.1 07/03/2016   PLT 245 07/03/2016    Assessment / Plan: 38wks and 2 days for induction due to gestational hypertensin FHR category 2 however overall reassuring.  Continue amnioinfusion.  Protracted active phase. MVU's are not adequate plan to continue pitocin and increase to reach adequate mvu's as long as fetal heart rate tracing remains overall reassuring.  GBS + continue pitocin.   Damarious Holtsclaw J. 07/04/2016, 5:39 PM

## 2016-07-04 NOTE — Progress Notes (Signed)
RN assisted pt to bathroom to urinate. Attempt unsuccessful. RN assisted pt back to bed. Nursery RN at bedside. Baby placed skin to skin for shots and meal ordered for pt. After pt eats, will try 2nd attempt to urinate. If unsuccessful RN will bladder scan and In and Out Cath. Pt states understanding and agrees with POC.

## 2016-07-04 NOTE — Progress Notes (Signed)
Dr Richardson Doppcole called to check on pt.  Reviewed strip remotely.  MD notified that pitocin is on 345mu/min.  May continue to increase pitocin as fetal heartrate tolerates

## 2016-07-04 NOTE — Progress Notes (Signed)
No order placed on previous shift for amnioinfusion.  New order placed at this time.

## 2016-07-04 NOTE — Anesthesia Rounding Note (Signed)
  CRNA Epidural Rounding Note  Patient: Carmen Baker, 45 y.o., female  Patient's current pain level: 0  Agreed upon pain management level: 4  Epidural intervention: No   Comments: patient comfortable  [facility],[name] 07/04/2016

## 2016-07-04 NOTE — Progress Notes (Addendum)
Elwyn LadeChristine Federer is a 45 y.o. G3P2002 at 564w2d  admitted for induction of labor due to gestational hypertension.  Subjective: Resting comfortably with epidural  Objective: BP 138/71   Pulse 79   Temp 98.3 F (36.8 C) (Oral)   Resp 18   Ht 5\' 7"  (1.702 m)   Wt 110.7 kg (244 lb)   SpO2 98%   BMI 38.22 kg/m   FHT: 145, moderate variability, +accels, occasional variable decels UC:   regular, every 2 minutes SVE: stretchy 5-6/75/-2, AROM clear fluid, IUPC placed  Labs: Lab Results  Component Value Date   WBC 17.8 (H) 07/03/2016   HGB 10.7 (L) 07/03/2016   HCT 31.4 (L) 07/03/2016   MCV 85.1 07/03/2016   PLT 245 07/03/2016    Assessment / Plan: 45yo [id number] @ [redacted]w[redacted]d who presents for IOL due to gestational HTN -FWB: Cat. II- pt repositioned, will continue to closely monitor.  Overall FHT reassuring -Labor: Expectant management, continue Pit per protocol -Pain management: continue epidural -GBS positive, continue PCN -Gestational HTN: BP improved s/p epidural suspect some elevation due to pain.  Will continue to closely monitor  Myna HidalgoJennifer Rena Sweeden, DO [phone] (pager) [phone] (office)   **ADDENDUM @ 0100:  FSE also placed due to difficulty with FHT tracing and recurrent variable decels x 4.  Amnioinfusion to be started.  FHT now 150, moderate variability, no accels, no decels.  Will continue to closely monitor.  Myna HidalgoJennifer Levenia Skalicky, DO [phone] (pager) [phone] (office)

## 2016-07-04 NOTE — Progress Notes (Signed)
Carmen Baker is a 45 y.o. G3P2002 at 6132w2d  admitted for induction of labor due to gestational hypertension .  Subjective: Pt is comfortable with her epidural.   Objective: BP 139/83   Pulse 94   Temp 98.4 F (36.9 C) (Oral)   Resp 20   Ht 5\' 7"  (1.702 m)   Wt 244 lb (110.7 kg)   SpO2 98%   BMI 38.22 kg/m  No intake/output data recorded. Total I/O In: -  Out: 450 [Urine:450]  FHT:  FHR: 130 bpm, variability: moderate,  accelerations:  Present,  decelerations:  Absent UC:   regular, every 2 minutes SVE:   Dilation: Lip/rim Effacement (%): 80 Station: +1 Exam by:: Dr Richardson Doppcole  Labs: Lab Results  Component Value Date   WBC 17.8 (H) 07/03/2016   HGB 10.7 (L) 07/03/2016   HCT 31.4 (L) 07/03/2016   MCV 85.1 07/03/2016   PLT 245 07/03/2016    Assessment / Plan: 38 wks and 2 days admitted for induction due to gestational hypertension.  Anterior lip+1 station.. Plan to recheck cervix in 30 minutes   Anticipate SVD Category 1 tracing  GBS + continue penicillin   Dayron Odland J. 07/04/2016, 5:43 PM

## 2016-07-04 NOTE — Progress Notes (Signed)
Carmen Baker is a 45 y.o. G3P2002 at 6539w2d  admitted for induction of labor due to gestational hypertension .  Subjective: Patient is comfortable with epidural   Objective: BP (!) 141/75   Pulse 70   Temp 98.3 F (36.8 C) (Oral)   Resp 18   Ht 5\' 7"  (1.702 m)   Wt 244 lb (110.7 kg)   SpO2 98%   BMI 38.22 kg/m  No intake/output data recorded. No intake/output data recorded.  FHT:  FHR: 120 bpm, variability: moderate,  accelerations:  Present,  decelerations:  Present variable  UC:   regular, every 2-3 minutes SVE:   Dilation: 8 Effacement (%): 80 Station: -2 Exam by:: Dr. Richardson Doppole  Labs: Lab Results  Component Value Date   WBC 17.8 (H) 07/03/2016   HGB 10.7 (L) 07/03/2016   HCT 31.4 (L) 07/03/2016   MCV 85.1 07/03/2016   PLT 245 07/03/2016    Assessment / Plan: Protracted active phase Fetal Wellbeing:  Category II pt with repetitive variable's with return to baseline Pain Control:  Epidural I/D:  pencillin Anticipated MOD:  questionable. discussed with patient r/o cesarean section in the event that baby does not tolerate labor, arrest of dilatation or arrest of descent including but not limited to infection bleeding damage to bowel bladder baby with the need for further surgery. She voiced understanding. Will continue to monitor closely for now.  Carmen Baker J. 07/04/2016, 7:33 AM

## 2016-07-04 NOTE — Progress Notes (Signed)
Carmen Baker is a 45 y.o. G3P2002 at 729w2d  admitted for induction of labor due to gestational hypertension.  Subjective: Resting comfortably with epidural  Objective: BP 134/74   Pulse 78   Temp 98.3 F (36.8 C) (Oral)   Resp 18   Ht 5\' 7"  (1.702 m)   Wt 110.7 kg (244 lb)   SpO2 98%   BMI 38.22 kg/m   FHT: 145, moderate variability, +accels, occasional variable decels UC:   q3-685min, inadeaquate MVU SVE: deferred, IUPC/FSE in place, amnioinfusion running  Labs: Lab Results  Component Value Date   WBC 17.8 (H) 07/03/2016   HGB 10.7 (L) 07/03/2016   HCT 31.4 (L) 07/03/2016   MCV 85.1 07/03/2016   PLT 245 07/03/2016    Assessment / Plan: 44yo [id number] @ 2129w2d who presents for IOL due to gestational HTN -FWB: Cat. II- internal monitors and amninoinfusion in place.  Overall FHT reassuring, will continue to closely monitor -Labor: Expectant management,overnight Pit had been discontinued, now restarted working on adequate MVUs -Pain management: continue epidural -GBS positive, continue PCN -Gestational HTN: BP stable, no further IV medication needed overnight.  Will continue to closely monitor  Myna HidalgoJennifer Vernon Maish, DO [phone]) [phone] (office)

## 2016-07-05 LAB — CBC
HCT: 27.7 % — ABNORMAL LOW (ref 36.0–46.0)
HEMATOCRIT: 27.6 % — AB (ref 36.0–46.0)
HEMOGLOBIN: 9.5 g/dL — AB (ref 12.0–15.0)
HEMOGLOBIN: 9.5 g/dL — AB (ref 12.0–15.0)
MCH: 29.1 pg (ref 26.0–34.0)
MCH: 29.3 pg (ref 26.0–34.0)
MCHC: 34.3 g/dL (ref 30.0–36.0)
MCHC: 34.4 g/dL (ref 30.0–36.0)
MCV: 85 fL (ref 78.0–100.0)
MCV: 85.2 fL (ref 78.0–100.0)
PLATELETS: 212 10*3/uL (ref 150–400)
Platelets: 215 10*3/uL (ref 150–400)
RBC: 3.26 MIL/uL — AB (ref 3.87–5.11)
RBC: 3.24 MIL/uL — AB (ref 3.87–5.11)
RDW: 14.6 % (ref 11.5–15.5)
RDW: 14.6 % (ref 11.5–15.5)
WBC: 22 10*3/uL — AB (ref 4.0–10.5)
WBC: 18.8 10*3/uL — AB (ref 4.0–10.5)

## 2016-07-05 LAB — LACTATE DEHYDROGENASE: LDH: 175 U/L (ref 98–192)

## 2016-07-05 LAB — PROTEIN / CREATININE RATIO, URINE
Creatinine, Urine: 55 mg/dL
PROTEIN CREATININE RATIO: 0.27 mg/mg{creat} — AB (ref 0.00–0.15)
TOTAL PROTEIN, URINE: 15 mg/dL

## 2016-07-05 LAB — COMPREHENSIVE METABOLIC PANEL
ALBUMIN: 2.3 g/dL — AB (ref 3.5–5.0)
ALK PHOS: 89 U/L (ref 38–126)
ALT: 12 U/L — AB (ref 14–54)
AST: 24 U/L (ref 15–41)
Anion gap: 6 (ref 5–15)
BILIRUBIN TOTAL: 0.4 mg/dL (ref 0.3–1.2)
BUN: 11 mg/dL (ref 6–20)
CO2: 20 mmol/L — ABNORMAL LOW (ref 22–32)
CREATININE: 1.21 mg/dL — AB (ref 0.44–1.00)
Calcium: 8.8 mg/dL — ABNORMAL LOW (ref 8.9–10.3)
Chloride: 110 mmol/L (ref 101–111)
GFR calc Af Amer: >60 mL/min (ref >60)
GFR calc non Af Amer: 54 mL/min — ABNORMAL LOW (ref >60)
GLUCOSE: 93 mg/dL (ref 65–99)
POTASSIUM: 3.3 mmol/L — AB (ref 3.5–5.1)
Sodium: 136 mmol/L (ref 135–145)
TOTAL PROTEIN: 6 g/dL — AB (ref 6.5–8.1)

## 2016-07-05 LAB — URIC ACID: Uric Acid, Serum: 5.4 mg/dL (ref 2.3–6.6)

## 2016-07-05 MED ORDER — LABETALOL HCL 5 MG/ML IV SOLN: 20.0000 mg | INTRAVENOUS | Status: DC | PRN

## 2016-07-05 MED ORDER — PANTOPRAZOLE SODIUM 40 MG PO TBEC
40.0000 mg | DELAYED_RELEASE_TABLET | Freq: Every day | ORAL | Status: DC
  Administered 2016-07-06 – 2016-07-08 (×3): 40 mg via ORAL
  Filled 2016-07-05 (×4): qty 1

## 2016-07-05 MED ORDER — IBUPROFEN 600 MG PO TABS
600.0000 mg | ORAL_TABLET | Freq: Four times a day (QID) | ORAL | Status: DC | PRN
  Administered 2016-07-06 – 2016-07-08 (×7): 600 mg via ORAL
  Filled 2016-07-05 (×7): qty 1

## 2016-07-05 MED ORDER — MAGNESIUM SULFATE BOLUS VIA INFUSION
4.0000 g | Freq: Once | INTRAVENOUS | Status: AC
  Administered 2016-07-05: 4 g via INTRAVENOUS
  Filled 2016-07-05: qty 500

## 2016-07-05 MED ORDER — HYDRALAZINE HCL 20 MG/ML IJ SOLN
5.0000 mg | INTRAMUSCULAR | Status: DC | PRN
  Administered 2016-07-08: 10 mg via INTRAVENOUS
  Filled 2016-07-05: qty 1

## 2016-07-05 MED ORDER — NIFEDIPINE ER 30 MG PO TB24
30.0000 mg | ORAL_TABLET | Freq: Every day | ORAL | Status: DC
  Administered 2016-07-05: 30 mg via ORAL
  Filled 2016-07-05 (×2): qty 1

## 2016-07-05 MED ORDER — LACTATED RINGERS IV SOLN
INTRAVENOUS | Status: DC
  Administered 2016-07-06: via INTRAVENOUS

## 2016-07-05 MED ORDER — LACTATED RINGERS IV SOLN
2.0000 g/h | INTRAVENOUS | Status: DC
  Administered 2016-07-05 – 2016-07-06 (×2): 2 g/h via INTRAVENOUS
  Filled 2016-07-05 (×2): qty 80

## 2016-07-05 NOTE — Anesthesia Postprocedure Evaluation (Signed)
Anesthesia Post Note  Patient: Carmen Baker  Procedure(s) Performed: * No procedures listed *  Patient location during evaluation: Mother Baby Anesthesia Type: Epidural Level of consciousness: awake, awake and alert and oriented Pain management: pain level controlled Vital Signs Assessment: post-procedure vital signs reviewed and stable Respiratory status: spontaneous breathing, nonlabored ventilation and respiratory function stable Cardiovascular status: stable Postop Assessment: no headache, no backache, patient able to bend at knees, no signs of nausea or vomiting and adequate PO intake Anesthetic complications: no     Last Vitals:  Vitals:   07/05/16 0015 07/05/16 0547  BP: (!) 146/80 (!) 150/86  Pulse: 88 67  Resp: 20 20  Temp: 37 C 36.4 C    Last Pain:  Vitals:   07/05/16 0547  TempSrc: Oral  PainSc:    Pain Goal: Patients Stated Pain Goal: 6 (07/03/16 0833)               Truitt LeepHYMER,Keilyn Nadal

## 2016-07-05 NOTE — Progress Notes (Signed)
Labs reviewed. Creatinine increased to 1.2 all other PIH labs normal.. Based on the above pt meets criteria for preeclampsia. She has severe range pressures this  Morning and afternoon.  Plan to start magnesium IV for seizure prophylaxis.  Labetalol IV for sbp greater than 160 or diastolic bp greater than 110.  Plan of care discussed with patient.  CCOB to cover starting at 7 pm this evening.

## 2016-07-05 NOTE — Progress Notes (Signed)
Dr Richardson Doppole called to transfer pt to 3rd floor for mag sulfate IV.

## 2016-07-05 NOTE — Plan of Care (Signed)
Problem: Education: Goal: Knowledge of the prescribed therapeutic regimen will improve Outcome: Completed/Met Date Met: 07/05/16 Patient has several questions about Magnesium therapy and verbalized understanding after explanation.  Problem: Coping: Goal: Coping behaviors will improve Outcome: Completed/Met Date Met: 07/05/16 Patient has lots of visitors on the early part of shift.  Has encouraged to have less stimulation, rest and sleep for blood pressure reasons. Goal: Ability to verbalize feelings will improve Outcome: Progressing With only one visitor, patient able to res and sleep.  Problem: Fluid Volume: Goal: Peripheral tissue perfusion will improve Outcome: Progressing Blood pressure readings improving.  Problem: Pain Management: Goal: Relief or control of pain will improve Outcome: Completed/Met Date Met: 07/05/16  No complaints of pain at this time.   

## 2016-07-05 NOTE — Lactation Note (Signed)
This note was copied from a baby's chart. Lactation Consultation Note  P3, BF 1st child for 5 weeks and 2nd child for 2 weeks.  Baby 16 hours and latched upon entering.  Shallow latch but mother states she does not want to move her. Mother unlatched baby after 10 min of feeding.  Mother stated her nurse last night told her to breastfeed for 10-15 min per side. Discussed breastfeeding on demand instead of limited feedings.  Mom encouraged to feed baby 8-12 times/24 hours and with feeding cues.  Mother plans to breastfeed in the hospital so "she gets the good stuff".  Provided education regarding benefits of breastfeeding for longer duration. Mom made aware of O/P services, breastfeeding support groups, community resources, and our phone # for post-discharge questions.    Patient Name: Carmen Baker Today's Date: 07/05/2016 Reason for consult: Initial assessment   Maternal Data Does the patient have breastfeeding experience prior to this delivery?: Yes  Feeding Feeding Type: Breast Fed Length of feed: 10 min  LATCH Score/Interventions Latch: Grasps breast easily, tongue down, lips flanged, rhythmical sucking. (latched upon entering )  Audible Swallowing: A few with stimulation Intervention(s): Skin to skin  Type of Nipple: Everted at rest and after stimulation  Comfort (Breast/Nipple): Soft / non-tender     Hold (Positioning): Assistance needed to correctly position infant at breast and maintain latch.  LATCH Score: 8  Lactation Tools Discussed/Used     Consult Status Consult Status: Follow-up Date: 07/06/16 Follow-up type: In-patient    Carmen Baker, Carmen Baker 07/05/2016, 11:41 AM

## 2016-07-05 NOTE — Progress Notes (Signed)
Post Partum Day 1 SVD Subjective: no complaints, voiding and tolerating PO pt denies headache, visual disturbances or ruq pain. Pain in back 4 out 10.   Objective: Blood pressure (!) 160/87, pulse 85, temperature 98.2 F (36.8 C), temperature source Oral, resp. rate 18, height 5\' 7"  (1.702 m), weight 244 lb (110.7 kg), SpO2 98 %, unknown if currently breastfeeding.  Physical Exam:  General: alert and cooperative Lochia: appropriate Uterine Fundus: firm Incision: NA DVT Evaluation: No evidence of DVT seen on physical exam.   Recent Labs  07/03/16 2109 07/05/16 0518  HGB 10.7* 9.5*  HCT 31.4* 27.7*    Assessment/Plan: Plan for discharge tomorrow if bp is well controlled.  Gestational hypertension. Elevated BP Procardia xl started this morning.  Pain control Ibuprofen scheduled.. Oxycodone prn  Routine prenatal care.  CCOB covering after 7 pm today FOllow up in the office in 1 wk for bp check   LOS: 3 days   Aleksandra Raben J. 07/05/2016, 1:18 PM

## 2016-07-06 LAB — CBC
HCT: 27.5 % — ABNORMAL LOW (ref 36.0–46.0)
Hemoglobin: 9.3 g/dL — ABNORMAL LOW (ref 12.0–15.0)
MCH: 29 pg (ref 26.0–34.0)
MCHC: 33.8 g/dL (ref 30.0–36.0)
MCV: 85.7 fL (ref 78.0–100.0)
PLATELETS: 230 10*3/uL (ref 150–400)
RBC: 3.21 MIL/uL — AB (ref 3.87–5.11)
RDW: 14.8 % (ref 11.5–15.5)
WBC: 14.7 10*3/uL — AB (ref 4.0–10.5)

## 2016-07-06 LAB — COMPREHENSIVE METABOLIC PANEL
ALK PHOS: 90 U/L (ref 38–126)
ALT: 13 U/L — AB (ref 14–54)
AST: 21 U/L (ref 15–41)
Albumin: 2.3 g/dL — ABNORMAL LOW (ref 3.5–5.0)
Anion gap: 6 (ref 5–15)
BUN: 11 mg/dL (ref 6–20)
CALCIUM: 8.5 mg/dL — AB (ref 8.9–10.3)
CO2: 22 mmol/L (ref 22–32)
CREATININE: 0.93 mg/dL (ref 0.44–1.00)
Chloride: 109 mmol/L (ref 101–111)
Glucose, Bld: 86 mg/dL (ref 65–99)
Potassium: 3 mmol/L — ABNORMAL LOW (ref 3.5–5.1)
Sodium: 137 mmol/L (ref 135–145)
Total Bilirubin: 0.3 mg/dL (ref 0.3–1.2)
Total Protein: 6.1 g/dL — ABNORMAL LOW (ref 6.5–8.1)

## 2016-07-06 LAB — BIRTH TISSUE RECOVERY COLLECTION (PLACENTA DONATION)

## 2016-07-06 LAB — MAGNESIUM: MAGNESIUM: 4.7 mg/dL — AB (ref 1.7–2.4)

## 2016-07-06 MED ORDER — DOCUSATE SODIUM 100 MG PO CAPS
100.0000 mg | ORAL_CAPSULE | Freq: Two times a day (BID) | ORAL | Status: DC | PRN
  Administered 2016-07-06 – 2016-07-07 (×3): 100 mg via ORAL
  Filled 2016-07-06 (×3): qty 1

## 2016-07-06 MED ORDER — BISACODYL 10 MG RE SUPP: 10.0000 mg | Freq: Every day | RECTAL | Status: DC | PRN

## 2016-07-06 MED ORDER — LABETALOL HCL 100 MG PO TABS
100.0000 mg | ORAL_TABLET | Freq: Two times a day (BID) | ORAL | Status: DC
  Administered 2016-07-06 – 2016-07-07 (×2): 100 mg via ORAL
  Filled 2016-07-06 (×2): qty 1

## 2016-07-06 NOTE — Progress Notes (Signed)
Consulted with Dr. Normand Sloopillard regarding d/c of mag and ? need for initiation of po anti-hypertensive.  Vitals:   07/06/16 1100 07/06/16 1449 07/06/16 1800 07/06/16 1845  BP: (!) 154/66 (!) 154/72 (!) 167/77 (!) 153/75  Pulse: 84 79 76 84  Resp: 18 18 18 18   Temp: 98.4 F (36.9 C) 98.7 F (37.1 C)    TempSrc: Oral Oral    SpO2: 98% 99%    Weight:      Height:       Will stop mag as planned at 7pm. Rx Labetalol 100 mg po BID--first dose now.  Ray ChurchVicki Suttyn Cryder,CNM 07/06/16 6:55p

## 2016-07-06 NOTE — Progress Notes (Signed)
Contact with Manfred ArchV. Latham. CMN. Magnesium d/c'd per order. New orders received. Carmelina DaneERRI L Cayce Quezada, RN

## 2016-07-06 NOTE — Progress Notes (Signed)
Subjective: Postpartum Day 2: Vaginal delivery, 2nd degree perineal laceration--gestational hypertension, with superimposed pre-eclampsia with severe features Patient up ad lib, reports no syncope or dizziness.  "Feel tired" since on mag. Feeding:  Breast Contraceptive plan:  Undecided  On magnesium since 1935 last night.   Objective: Vital signs in last 24 hours: Temp:  [97.6 F (36.4 C)-98.4 F (36.9 C)] 98.1 F (36.7 C) (09/16 0646) Pulse Rate:  [75-94] 77 (09/16 0646) Resp:  [16-24] 20 (09/16 0646) BP: (130-163)/(65-92) 157/83 (09/16 0646) SpO2:  [96 %-100 %] 97 % (09/16 0646) Weight:  [107.2 kg (236 lb 4 oz)] 107.2 kg (236 lb 4 oz) (09/16 0300)  Vitals:   07/06/16 0600 07/06/16 0646  BP:  (!) 157/83  Pulse:  77  Resp: 20 20  Temp:  98.1 F (36.7 C)   24 hour I/O balance 1,515 cc Last shift output 1265.  Physical Exam:  General: alert Lochia: appropriate Uterine Fundus: firm Perineum: healing well DVT Evaluation: No evidence of DVT seen on physical exam. Negative Homan's sign.  DTR 2+, no clonus, 1+ edema.   CBC Latest Ref Rng & Units 07/06/2016 07/05/2016 07/05/2016  WBC 4.0 - 10.5 K/uL 14.7(H) 18.8(H) 22.0(H)  Hemoglobin 12.0 - 15.0 g/dL 1.6(X9.3(L) 0.9(U9.5(L) 0.4(V9.5(L)  Hematocrit 36.0 - 46.0 % 27.5(L) 27.6(L) 27.7(L)  Platelets 150 - 400 K/uL 230 215 212   CMP Latest Ref Rng & Units 07/06/2016 07/05/2016 07/02/2016  Glucose 65 - 99 mg/dL 86 93 409(W113(H)  BUN 6 - 20 mg/dL 11 11 8   Creatinine 0.44 - 1.00 mg/dL 1.190.93 1.47(W1.21(H) 2.950.63  Sodium 135 - 145 mmol/L 137 136 136  Potassium 3.5 - 5.1 mmol/L 3.0(L) 3.3(L) 3.4(L)  Chloride 101 - 111 mmol/L 109 110 108  CO2 22 - 32 mmol/L 22 20(L) 20(L)  Calcium 8.9 - 10.3 mg/dL 6.2(Z8.5(L) 3.0(Q8.8(L) 9.0  Total Protein 6.5 - 8.1 g/dL 6.1(L) 6.0(L) 6.4(L)  Total Bilirubin 0.3 - 1.2 mg/dL 0.3 0.4 0.4  Alkaline Phos 38 - 126 U/L 90 89 97  AST 15 - 41 U/L 21 24 19   ALT 14 - 54 U/L 13(L) 12(L) 13(L)    Assessment/Plan: Status post vaginal delivery  day 2 Gestational hypertension with superimposed pre-eclampsia with severe features--on Mag since 1835 Stable Continue current care. Anticipate d/c mag at 1835.    Nyra CapesLATHAM, VICKICNM 07/06/2016, 8:33 AM

## 2016-07-06 NOTE — Progress Notes (Signed)
Manfred ArchV. Latham, CNM notified in regards to BP 167/77.  Provider states that she will speak to MD and call this RN back. Carmelina DaneERRI L Toure Edmonds, RN

## 2016-07-06 NOTE — Plan of Care (Signed)
Problem: Education: Goal: Knowledge of condition will improve Outcome: Completed/Met Date Met: 07/06/16 Patient claims " no questions on teachings done by  Nurses."  Problem: Coping: Goal: Ability to identify and utilize available resources and services will improve Outcome: Completed/Met Date Met: 07/06/16 Emphasized lactation services and other educational services offered by [facility].  Problem: Life Cycle: Goal: Chance of risk for complications during the postpartum period will decrease Outcome: Completed/Met Date Met: 07/06/16 Postpartum preeclampsia resolving.  Labetalol 134ms BID PO started.  Emphasized good rest and sleep.  Problem: Role Relationship: Goal: Ability to demonstrate positive interaction with newborn will improve Outcome: Completed/Met Date Met: 07/06/16 Incorporating baby to family unit.  Has observed good bonding with mother.  Problem: Bowel/Gastric: Goal: Gastrointestinal status will improve Outcome: Not Applicable Date Met: 058/48/35Regular diet tolerated well.  Bowel movement today 07/06/2016  Problem: Coping: Goal: Level of anxiety will decrease Outcome: Completed/Met Date Met: 07/06/16 Good interaction with FOB.  Has advised 3 visitors at a time since there were more than in the room at several times with loud conversations. Very receptive.  Problem: Fluid Volume: Goal: Peripheral tissue perfusion will improve Outcome: Completed/Met Date Met: 07/06/16 Blood pressure readings improving.

## 2016-07-06 NOTE — Lactation Note (Signed)
This note was copied from a baby's chart. Lactation Consultation Note  Patient Name: Girl Elwyn LadeChristine Skidgel JXBJY'NToday's Date: 07/06/2016 Reason for consult: Follow-up assessment Baby at 8% weight loss Has been to breast 6 times in past 24 hours for 15-40 minutes, 4 voids/5 stools. Mom reports baby last BF around 12:00. Advised Mom baby needs to be at breast 8-12 times in 24 hours and with feeding ques. Mom did not want to attempt latch at this visit due to company in room. LC reported she would see another patient and then come back to assist with latch to be sure baby transferring milk well. Mom reports she plans to BR/BO feed. LC advised Mom to be sure to BF with each feeding both breasts before giving any bottles to encourage milk production, prevent engorgement and protect milk supply. Discussed importance of deep latch to milk transfer. Will f/u to observe latch. Last LS=10 by RN.  Before returning, LC was advised that Mom declined LC services for assist with latch.     Maternal Data    Feeding    LATCH Score/Interventions                      Lactation Tools Discussed/Used     Consult Status Consult Status: Follow-up Date: 07/06/16 Follow-up type: In-patient    Alfred LevinsGranger, Delberta Folts Ann 07/06/2016, 4:08 PM

## 2016-07-07 MED ORDER — LABETALOL HCL 300 MG PO TABS: 300.0000 mg | ORAL_TABLET | Freq: Two times a day (BID) | ORAL | 1 refills | Status: DC

## 2016-07-07 MED ORDER — NIFEDIPINE ER 30 MG PO TB24
30.0000 mg | ORAL_TABLET | Freq: Every day | ORAL | Status: DC
  Administered 2016-07-07: 30 mg via ORAL
  Filled 2016-07-07 (×2): qty 1

## 2016-07-07 MED ORDER — IBUPROFEN 600 MG PO TABS: 600.0000 mg | ORAL_TABLET | Freq: Four times a day (QID) | ORAL | 0 refills | Status: DC | PRN

## 2016-07-07 MED ORDER — LABETALOL HCL 200 MG PO TABS: 300.0000 mg | ORAL_TABLET | Freq: Two times a day (BID) | ORAL | Status: DC

## 2016-07-07 MED ORDER — LABETALOL HCL 200 MG PO TABS
200.0000 mg | ORAL_TABLET | Freq: Once | ORAL | Status: AC
  Administered 2016-07-07: 200 mg via ORAL
  Filled 2016-07-07: qty 1

## 2016-07-07 NOTE — Progress Notes (Signed)
Post Partum Day 3   Subjective: no complaints, up ad lib, voiding, tolerating PO and a little upset because baby can not be discharged today secondary to needing bili therapy.  Denies Ha, visual changes or abdominal pain.  Objective: Blood pressure (!) 148/79, pulse 75, temperature 98.2 F (36.8 C), temperature source Oral, resp. rate 18, height 5\' 7"  (1.702 m), weight 236 lb 4 oz (107.2 kg), SpO2 96 %, unknown if currently breastfeeding.  Physical Exam:  General: alert and no distress Lochia: appropriate Uterine Fundus: firm Incision: n/a DVT Evaluation: No evidence of DVT seen on physical exam.   Recent Labs  07/05/16 1309 07/06/16 0600  HGB 9.5* 9.3*  HCT 27.6* 27.5*    Assessment/Plan: Plan for discharge tomorrow  BPs still remain elevated despite increase in labetalol.  Will switch to procardia xl 30mg  qd. Cont to observe overnight Cont routine PP care   LOS: 5 days   Carmen Baker Y 07/07/2016, 3:22 PM

## 2016-07-07 NOTE — Progress Notes (Signed)
UR chart review completed.  

## 2016-07-07 NOTE — Lactation Note (Signed)
This note was copied from a baby's chart. Lactation Consultation Note  Patient Name: Carmen Baker ZOXWR'UToday's Date: 07/07/2016 Reason for consult: Follow-up assessment   Follow up consult with mom of 65 hour old infant. Infant under double phototherapy with bilirubin of > 16. Infant with 7 BF for 19-40 minutes, formula via bottle x 1 of 15 cc, 4 voids and 5 stools in 24 hours preceding this assessment. LATCH Scores 10 by bedside RN's. Infant weight 7 lb 11 oz with 10 % weigh loss, mom has began using formula.  Mom reports breasts are soft and maybe a little fuller. She reports she plans to let the baby let her know when she wants to eat. She plans to breast feed first and then give formula here and at home. She is currently using Alimentum and will need to be switched to Similac pro advanced prior to d/c home. She declined a pump and reports "I am not a pumping kind of person". Mom declined feeding assistance.   Mom without questions/concerns prn. Enc mom to call if she would like feeding assistance.    Maternal Data    Feeding Feeding Type: Bottle Fed - Formula Nipple Type: Slow - flow Length of feed: 30 min  LATCH Score/Interventions                      Lactation Tools Discussed/Used     Consult Status Consult Status: PRN Follow-up type: Call as needed    Ed BlalockSharon S [facility] 07/07/2016, 12:27 PM

## 2016-07-07 NOTE — Progress Notes (Signed)
Dr. Su Hiltoberts notified of BP of 148/79. Pt unable to D/C today. Pt made aware and verbalized and understanding of the need to stay. Carmelina DaneERRI L Lamica Mccart, RN

## 2016-07-08 ENCOUNTER — Encounter (HOSPITAL_COMMUNITY): Payer: Self-pay

## 2016-07-08 DIAGNOSIS — O1490 Unspecified pre-eclampsia, unspecified trimester: Secondary | ICD-10-CM | POA: Diagnosis present

## 2016-07-08 MED ORDER — LABETALOL HCL 5 MG/ML IV SOLN
20.0000 mg | Freq: Once | INTRAVENOUS | Status: AC
  Administered 2016-07-08: 20 mg via INTRAVENOUS
  Filled 2016-07-08: qty 4

## 2016-07-08 MED ORDER — NIFEDIPINE ER 60 MG PO TB24: 60.0000 mg | ORAL_TABLET | Freq: Every day | ORAL | 1 refills | Status: DC

## 2016-07-08 MED ORDER — NIFEDIPINE ER 60 MG PO TB24
60.0000 mg | ORAL_TABLET | Freq: Every day | ORAL | Status: DC
  Administered 2016-07-08: 60 mg via ORAL
  Filled 2016-07-08 (×2): qty 1

## 2016-07-08 NOTE — Discharge Summary (Signed)
OB Discharge Summary     Patient Name: Carmen Baker DOB: 1971/01/20 MRN: [id number]  Date of admission: 07/02/2016 Delivering MD: Gerald Leitz   Date of discharge: 07/08/2016  Admitting diagnosis: 35.2WKS CTX Intrauterine pregnancy: [redacted]w[redacted]d     Secondary diagnosis:  Principal Problem:   Vaginal delivery Active Problems:   Gestational hypertension, antepartum   Gestational hypertension   Preeclampsia  Additional problems: None     Discharge diagnosis: Term Pregnancy Delivered       Preeclampsia                                                                                          Post partum procedures:Magnesium sulfate IV for seizure prophylaxis  Augmentation: Pitocin and Cytotec  Complications: None  Hospital course:  Induction of Labor With Vaginal Delivery   45 y.o. yo G3P3003 at [redacted]w[redacted]d was admitted to the hospital 07/02/2016 for induction of labor.  Indication for induction: Gestational hypertension.  Patient had an uncomplicated labor course as follows: Membrane Rupture Time/Date: 12:30 AM ,07/04/2016   Intrapartum Procedures: Episiotomy: None [1]                                         Lacerations:  2nd degree [3];Perineal [11]  Patient had delivery of a Viable infant.  Information for the patient's newborn:  Davia, Smyre [[id number]]  Delivery Method: Vag-Spont   07/04/2016  Details of delivery can be found in separate delivery note.  Patient had a routine postpartum course. Patient is discharged home 07/08/16.   Physical exam Vitals:   07/08/16 0923 07/08/16 1035 07/08/16 1423 07/08/16 1821  BP: (!) 171/78 (!) 151/67 (!) 154/72 (!) 141/75  Pulse: 77 68 87 91  Resp: 18  18 18   Temp: 98.7 F (37.1 C)  98.9 F (37.2 C)   TempSrc: Oral  Oral   SpO2: 99%  100%   Weight:      Height:       General: alert, cooperative and no distress Lochia: appropriate Uterine Fundus: firm Incision: N/A DVT Evaluation: No evidence of DVT seen on physical  exam. Labs: Lab Results  Component Value Date   WBC 14.7 (H) 07/06/2016   HGB 9.3 (L) 07/06/2016   HCT 27.5 (L) 07/06/2016   MCV 85.7 07/06/2016   PLT 230 07/06/2016   CMP Latest Ref Rng & Units 07/06/2016  Glucose 65 - 99 mg/dL 86  BUN 6 - 20 mg/dL 11  Creatinine 0.86 - 5.78 mg/dL 4.69  Sodium 629 - 528 mmol/L 137  Potassium 3.5 - 5.1 mmol/L 3.0(L)  Chloride 101 - 111 mmol/L 109  CO2 22 - 32 mmol/L 22  Calcium 8.9 - 10.3 mg/dL 4.1(L)  Total Protein 6.5 - 8.1 g/dL 6.1(L)  Total Bilirubin 0.3 - 1.2 mg/dL 0.3  Alkaline Phos 38 - 126 U/L 90  AST 15 - 41 U/L 21  ALT 14 - 54 U/L 13(L)    Discharge instruction: per After Visit Summary and "Baby and Me Booklet".  After visit meds:  Medication List    TAKE these medications   ibuprofen 600 MG tablet Commonly known as:  ADVIL,MOTRIN Take 1 tablet (600 mg total) by mouth every 6 (six) hours as needed for fever, headache, mild pain, moderate pain or cramping.   IRON PO Take 1 tablet by mouth at bedtime.   NIFEdipine 60 MG 24 hr tablet Commonly known as:  PROCARDIA-XL/ADALAT CC Take 1 tablet (60 mg total) by mouth daily. Start taking on:  07/09/2016   pantoprazole 40 MG tablet Commonly known as:  PROTONIX Take 40 mg by mouth daily.   prenatal multivitamin Tabs tablet Take 1 tablet by mouth at bedtime.   triamcinolone cream 0.5 % Commonly known as:  KENALOG Apply 1 application topically 2 (two) times daily.       Diet: low salt diet  Activity: Advance as tolerated. Pelvic rest for 6 weeks.   Outpatient follow up:1 week for blood pressure check  Follow up Appt:No future appointments. Follow up Visit:No Follow-up on file.  Postpartum contraception: None  Newborn Data: Live born female  Birth Weight: 8 lb 8.5 oz (3870 g) APGAR: 7, 9  Baby Feeding: Bottle Disposition:home with mother   07/08/2016 Jessee AversOLE,Tresha Muzio J., MD

## 2016-07-08 NOTE — Plan of Care (Signed)
Problem: Physical Regulation: Goal: Will remain free of preeclampsia complications Outcome: Adequate for Discharge Blood pressure reading improved with antihypertensive med..Marland Kitchen

## 2016-07-08 NOTE — Progress Notes (Signed)
I offered emotional support to pt who was feeling frustrated that she is still in the hospital and that her baby is still having difficulties as well.  She is eager to get home and be a family with her SO and her 44 year old daughter.  Her extended family, as well as her 44 year-old son, live up in [place], [place] and will not be coming down to meet her new daughter until the end of the month when she was expecting her daughter to be born.  I offered reflective listening as she shared her story with me and offered affirmation at her desire to be home with her family.  Chaplain Dyanne CarrelKaty Orian Figueira, Bcc Pager, [phone] 1:12 PM    07/08/16 1300  Clinical Encounter Type  Visited With Patient  Visit Type Spiritual support  Referral From Nurse  Spiritual Encounters  Spiritual Needs Emotional

## 2016-07-08 NOTE — Progress Notes (Signed)
Per nurse bp has improved. Patient would like to be discharged home if baby is discharged this evening.    bp currently 141/75  Plan discharge home on procardia XL 60 mg daily  Pt to follow up in the office in 1 wk for bp check

## 2016-07-08 NOTE — Lactation Note (Signed)
This note was copied from a baby's chart. Lactation Consultation Note  Patient Name: Girl Elwyn LadeChristine Schneiderman ZOXWR'UToday's Date: 07/08/2016 Reason for consult: Follow-up assessment;Hyperbilirubinemia   Follow up with mom of 85 hour old infant. Infant with 6 BF for 25-30 minutes, 4 bottle feeds of 15-25 cc, 4 voids and 6 stools in last 24 hours. Infant weight 7 lb 9.9 oz with weight loss of 11 %. Infant on double phototherapy and bilirubin increased to 17.1. Mom still experiencing high BP's today.   Mom was speaking with pediatrician and decision was made for mom to stop BF and to feed infant formula. Gave mom formula supplementation guidelines and enc mom to feed infant at least every 3 hours with 45-60 cc formula. Infant currently on Alimentum, discussed switching infant to Similac Advanced formula now that not BF. Mom voiced understanding.   Asked mom if she needs information in drying up milk and she said no she has done this with her older children. Infant will not be d/c home today, enc mom to call with questions/concerns prn.      Maternal Data Formula Feeding for Exclusion: Yes  Feeding Feeding Type: Bottle Fed - Formula  LATCH Score/Interventions                      Lactation Tools Discussed/Used     Consult Status Consult Status: Complete Date: 07/08/16 Follow-up type: In-patient    Silas FloodSharon S Imanni Burdine 07/08/2016, 9:18 AM

## 2016-07-08 NOTE — Progress Notes (Signed)
Dr. Richardson Doppole notified of pts Bp; order given for 10ml hydralizine with BP recheck in 10 min.

## 2016-07-08 NOTE — Progress Notes (Signed)
Dr. Richardson Doppole paged due to continued high BP, despite 10ml hydralazine given. Order to give procardia 60mg  now

## 2016-07-08 NOTE — Progress Notes (Signed)
Post Partum Day 4 SVD Subjective: no complaints, up ad lib and voiding pt denies headache visual disturbances or ruq pain. Lochia is minimal and pain is well controlled. She is frustrated that she is still in the hospital . Her baby is undergoing bili therapy and may not be able to go home today. bp elevated this morning 160's over 80's   Objective: Blood pressure (!) 154/72, pulse 87, temperature 98.9 F (37.2 C), temperature source Oral, resp. rate 18, height 5\' 7"  (1.702 m), weight 235 lb (106.6 kg), SpO2 100 %, unknown if currently breastfeeding.  Physical Exam:  General: alert and cooperative Lochia: appropriate Uterine Fundus: firm Incision: NA DVT Evaluation: No evidence of DVT seen on physical exam.   Recent Labs  07/06/16 0600  HGB 9.3*  HCT 27.5*    Assessment/Plan: Day 4 s/p SVD with preeclampsia postpartum  BP remains elevated  Pt given hydralazine 10 mg this morning. Will increase procardia XL from 30 mg to 60 mg Monitor over night if improved d/c in AM   LOS: 6 days   Carmen Baker J. 07/08/2016, 6:18 PM

## 2016-07-08 NOTE — Progress Notes (Signed)
MD notified of BP of 171/78 order given for labetalol 20mg  IV. Instructed to call if BP greater than 160/110

## 2016-09-11 ENCOUNTER — Encounter (HOSPITAL_COMMUNITY): Payer: Self-pay | Admitting: Emergency Medicine

## 2016-09-11 ENCOUNTER — Emergency Department (HOSPITAL_COMMUNITY): Payer: BLUE CROSS/BLUE SHIELD

## 2016-09-11 ENCOUNTER — Emergency Department (HOSPITAL_COMMUNITY)
Admission: EM | Admit: 2016-09-11 | Discharge: 2016-09-11 | Disposition: A | Discharge status: Home / Self Care | Payer: BLUE CROSS/BLUE SHIELD | Attending: Emergency Medicine | Admitting: Emergency Medicine

## 2016-09-11 DIAGNOSIS — I10 Essential (primary) hypertension: Secondary | ICD-10-CM | POA: Insufficient documentation

## 2016-09-11 DIAGNOSIS — M25532 Pain in left wrist: Secondary | ICD-10-CM | POA: Diagnosis not present

## 2016-09-11 MED ORDER — NAPROXEN 250 MG PO TABS: 250.0000 mg | ORAL_TABLET | Freq: Two times a day (BID) | ORAL | 0 refills | Status: DC

## 2016-09-11 MED ORDER — ACETAMINOPHEN 325 MG PO TABS
650.0000 mg | ORAL_TABLET | Freq: Once | ORAL | Status: AC
  Administered 2016-09-11: 650 mg via ORAL
  Filled 2016-09-11: qty 2

## 2016-09-11 NOTE — ED Triage Notes (Signed)
Left wrist pain that staretd since she had her daughter 10 weeks ago, it started to click and then last night she rolled over it clicked and   it is in pain all the time now, states cannot have any pressure on it she states, denies any injury,  Pain rads to base of thumb and into hand

## 2016-09-11 NOTE — ED Provider Notes (Signed)
[facility]-EMERGENCY DEPT Provider Note   CSN: [id number] Arrival date & time: 09/11/16  1503  By signing my name below, I, Freida Busman, attest that this documentation has been prepared under the direction and in the presence of Everlene Farrier, PA-C. Electronically Signed: Freida Busman, Scribe. 09/11/2016. 3:42 PM.  History   Chief Complaint Chief Complaint  Patient presents with  . Wrist Pain    The history is provided by the patient. No language interpreter was used.     HPI Comments:  Carmen Baker is a 45 y.o. female who presents to the Emergency Department complaining of moderate pain to the left wrist x 8-9 weeks, worse this AM. Her pain is worse with movement. Pt reports associated intermittent tingling sensation to the left thumb. She denies numbness/weakness in the extremity. She also denies recent injury and pain to elbow or shoulder. Pt is 10 weeks postpartum and is not currently breast feeding. She is right hand dominant    Past Medical History:  Diagnosis Date  . Hypertension     Patient Active Problem List   Diagnosis Date Noted  . Preeclampsia 07/08/2016  . Vaginal delivery 07/04/2016  . Gestational hypertension, antepartum 07/02/2016  . Gestational hypertension 07/02/2016  . AMA (advanced maternal age) multigravida 35+ 04/30/2016  . History of gestational hypertension--2nd pregnancy 04/30/2016  . Rh negative state in antepartum period 04/30/2016  . Reflux 04/30/2016    Past Surgical History:  Procedure Laterality Date  . NO PAST SURGERIES      OB History    Gravida Para Term Preterm AB Living   3 3 3     3    SAB TAB Ectopic Multiple Live Births         0 3       Home Medications    Prior to Admission medications   Medication Sig Start Date End Date Taking? Authorizing Provider  ibuprofen (ADVIL,MOTRIN) 600 MG tablet Take 1 tablet (600 mg total) by mouth every 6 (six) hours as needed for fever, headache, mild pain, moderate pain or cramping.  07/07/16   Osborn Coho, MD  IRON PO Take 1 tablet by mouth at bedtime.    Historical Provider, MD  naproxen (NAPROSYN) 250 MG tablet Take 1 tablet (250 mg total) by mouth 2 (two) times daily with a meal. 09/11/16   Everlene Farrier, PA-C  NIFEdipine (PROCARDIA-XL/ADALAT CC) 60 MG 24 hr tablet Take 1 tablet (60 mg total) by mouth daily. 07/09/16   Gerald Leitz, MD  pantoprazole (PROTONIX) 40 MG tablet Take 40 mg by mouth daily. 06/04/16   Historical Provider, MD  Prenatal Vit-Fe Fumarate-FA (PRENATAL MULTIVITAMIN) TABS tablet Take 1 tablet by mouth at bedtime.    Historical Provider, MD  triamcinolone cream (KENALOG) 0.5 % Apply 1 application topically 2 (two) times daily. 06/28/16   Historical Provider, MD    Family History No family history on file.  Social History Social History  Substance Use Topics  . Smoking status: Never Smoker  . Smokeless tobacco: Never Used  . Alcohol use No     Allergies   Patient has no known allergies.   Review of Systems Review of Systems  Constitutional: Negative for fever.  Musculoskeletal: Positive for arthralgias. Negative for joint swelling.  Skin: Negative for color change, rash and wound.  Neurological: Negative for weakness and numbness.     Physical Exam Updated Vital Signs BP 160/94 (BP Location: Right Arm)   Pulse 76   Temp 98.2 F (36.8 C) (Oral)  Resp 20   Ht 5\' 7"  (1.702 m)   Wt 94.8 kg   LMP 09/11/2016 (Exact Date)   SpO2 99%   BMI 32.73 kg/m   Physical Exam  Constitutional: She appears well-developed and well-nourished. No distress.  HENT:  Head: Normocephalic and atraumatic.  Eyes: Right eye exhibits no discharge. Left eye exhibits no discharge.  Cardiovascular: Normal rate, regular rhythm and intact distal pulses.   Bilateral radial pulses intact   Pulmonary/Chest: Effort normal. No respiratory distress.  Musculoskeletal: Normal range of motion. She exhibits tenderness. She exhibits no edema or deformity.  Mild TTP to  lateral aspect of left wrist. No snuffbox tenderness; no skin changes or rashes. No left wrist erythema or edema or deformity.  Good cap refill Good active and passive ROM No left elbow or shoulder TTP.     Neurological: She is alert. Coordination normal.  Sensation is intact in her bilateral distal fingertips.  Skin: Skin is warm and dry. Capillary refill takes less than 2 seconds. No rash noted. She is not diaphoretic. No erythema. No pallor.  Psychiatric: She has a normal mood and affect. Her behavior is normal.  Nursing note and vitals reviewed.    ED Treatments / Results  DIAGNOSTIC STUDIES:  Oxygen Saturation is 99% on RA, normal by my interpretation.    COORDINATION OF CARE:  3:34 PM Discussed treatment plan with pt at bedside and pt agreed to plan.  Labs (all labs ordered are listed, but only abnormal results are displayed) Labs Reviewed - No data to display  EKG  EKG Interpretation None       Radiology Dg Wrist Complete Left  Result Date: 09/11/2016 CLINICAL DATA:  Posterior left wrist pain radiating into the first metacarpal for 10 weeks. No known injury. Initial encounter. EXAM: LEFT WRIST - COMPLETE 3+ VIEW COMPARISON:  None. FINDINGS: There is no evidence of fracture or dislocation. There is no evidence of arthropathy or other focal bone abnormality. Soft tissues are unremarkable. IMPRESSION: Negative exam. Electronically Signed   By: Drusilla Kannerhomas  Dalessio M.D.   On: 09/11/2016 15:30    Procedures Procedures (including critical care time)  Medications Ordered in ED Medications  acetaminophen (TYLENOL) tablet 650 mg (650 mg Oral Given 09/11/16 1544)     Initial Impression / Assessment and Plan / ED Course  I have reviewed the triage vital signs and the nursing notes.  Pertinent labs & imaging results that were available during my care of the patient were reviewed by me and considered in my medical decision making (see chart for details).  Clinical Course      This is a 45 y.o. female who is right hand dominant who presents to the Emergency Department complaining of moderate pain to the left wrist x 8-9 weeks, worse this AM. Her pain is worse with movement. Pt reports associated intermittent tingling sensation to the left thumb. She denies numbness/weakness in the extremity. She also denies recent injury and pain to elbow or shoulder. On exam the patient is afebrile nontoxic appearing. Just mild tenderness to the lateral aspect of her left wrist. No left wrist erythema, deformity, ecchymosis or warmth. Good active and passive range of motion. She is neurovascularly intact. X-ray is unremarkable. Will place in a Velcro wrist splint and have her follow-up with sports medicine clinic. Naproxen and ice for pain control. I discussed return precautions. I advised the patient to follow-up with their primary care provider this week. I advised the patient to return to the emergency  department with new or worsening symptoms or new concerns. The patient verbalized understanding and agreement with plan.    Final Clinical Impressions(s) / ED Diagnoses   Final diagnoses:  Left wrist pain    New Prescriptions New Prescriptions   NAPROXEN (NAPROSYN) 250 MG TABLET    Take 1 tablet (250 mg total) by mouth 2 (two) times daily with a meal.   I personally performed the services described in this documentation, which was scribed in my presence. The recorded information has been reviewed and is accurate.       Everlene FarrierWilliam Callaway Hardigree, PA-C 09/11/16 1548    Jacalyn LefevreJulie Haviland, MD 09/11/16 [phone]

## 2017-01-04 IMAGING — CR DG WRIST COMPLETE 3+V*L*
4 series · 4 of 4 positions shown · non-contrast
Comparison: None.

CLINICAL DATA: Posterior left wrist pain radiating into the first
metacarpal for 10 weeks. No known injury. Initial encounter.

EXAM:
LEFT WRIST - COMPLETE 3+ VIEW

[wrist pa]
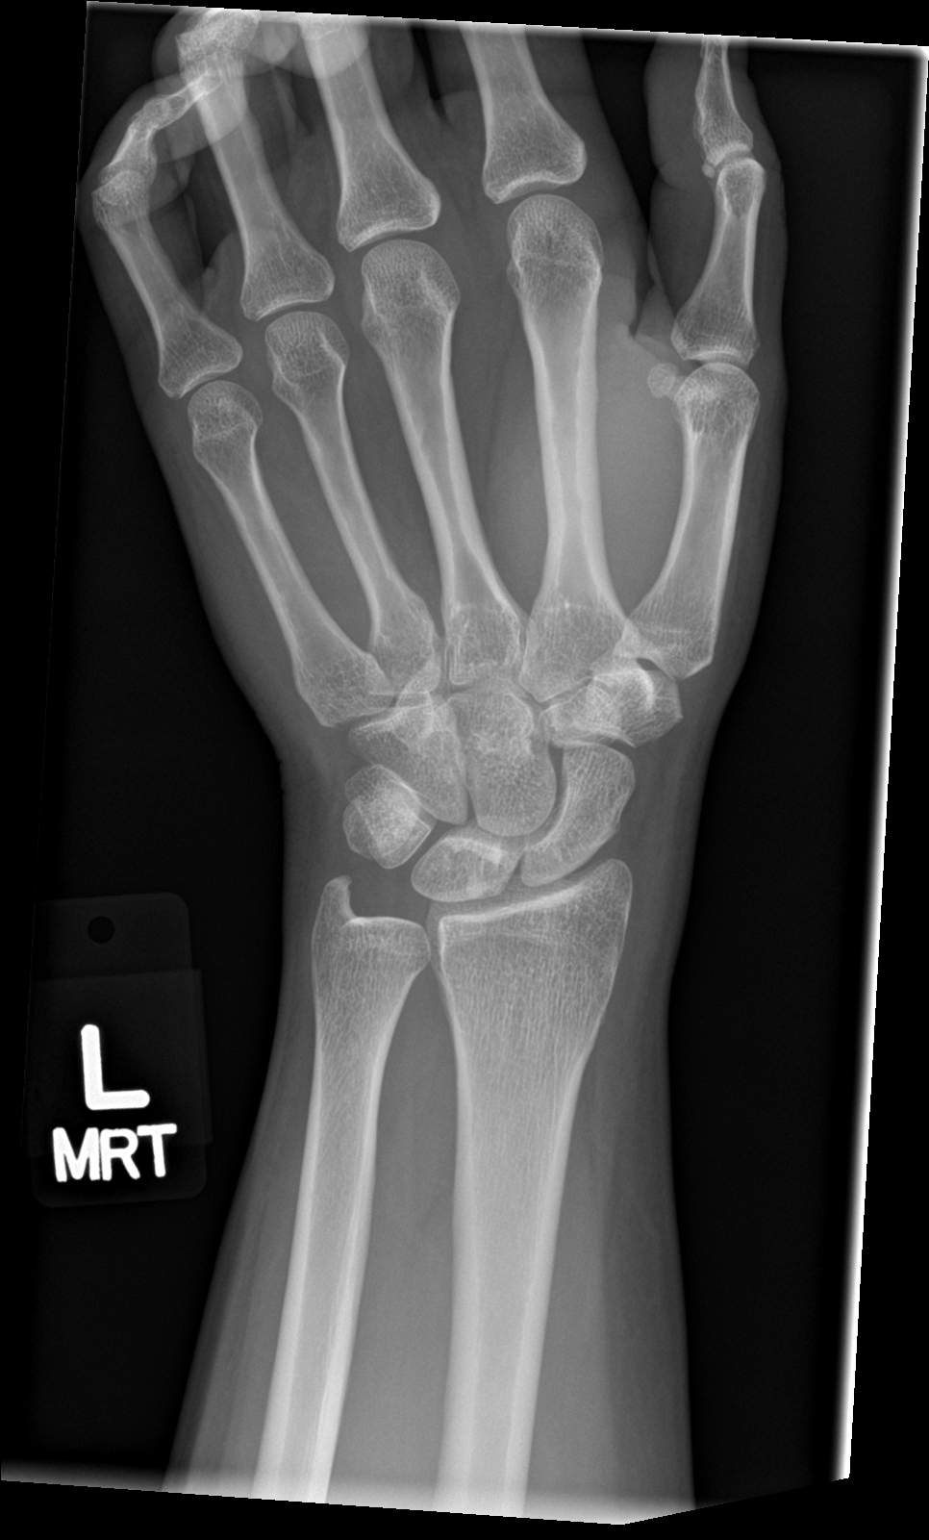

[wrist obl]
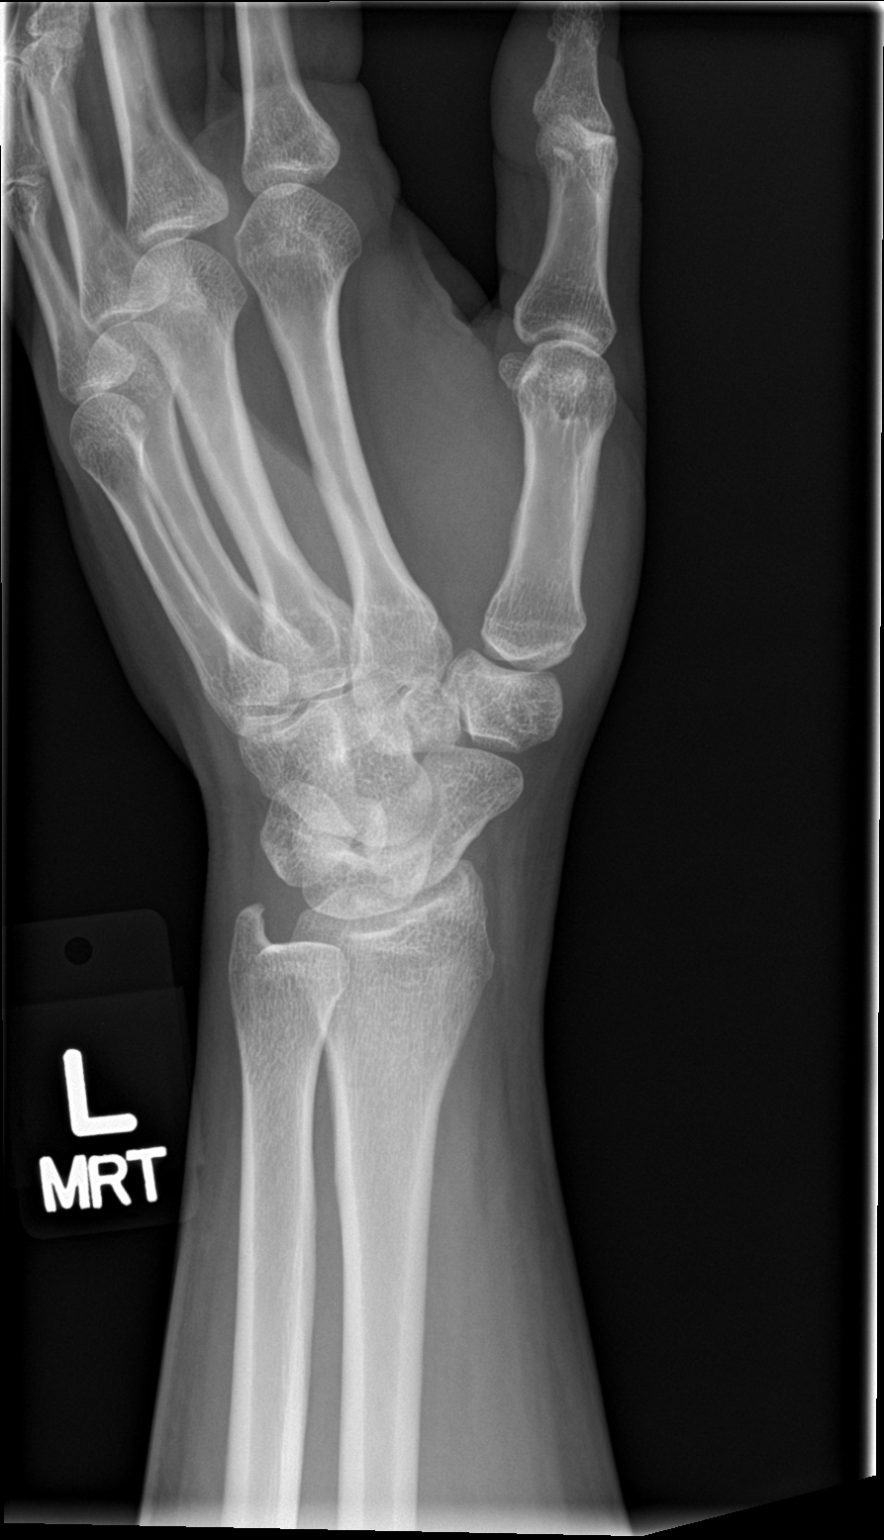

[wrist lat]
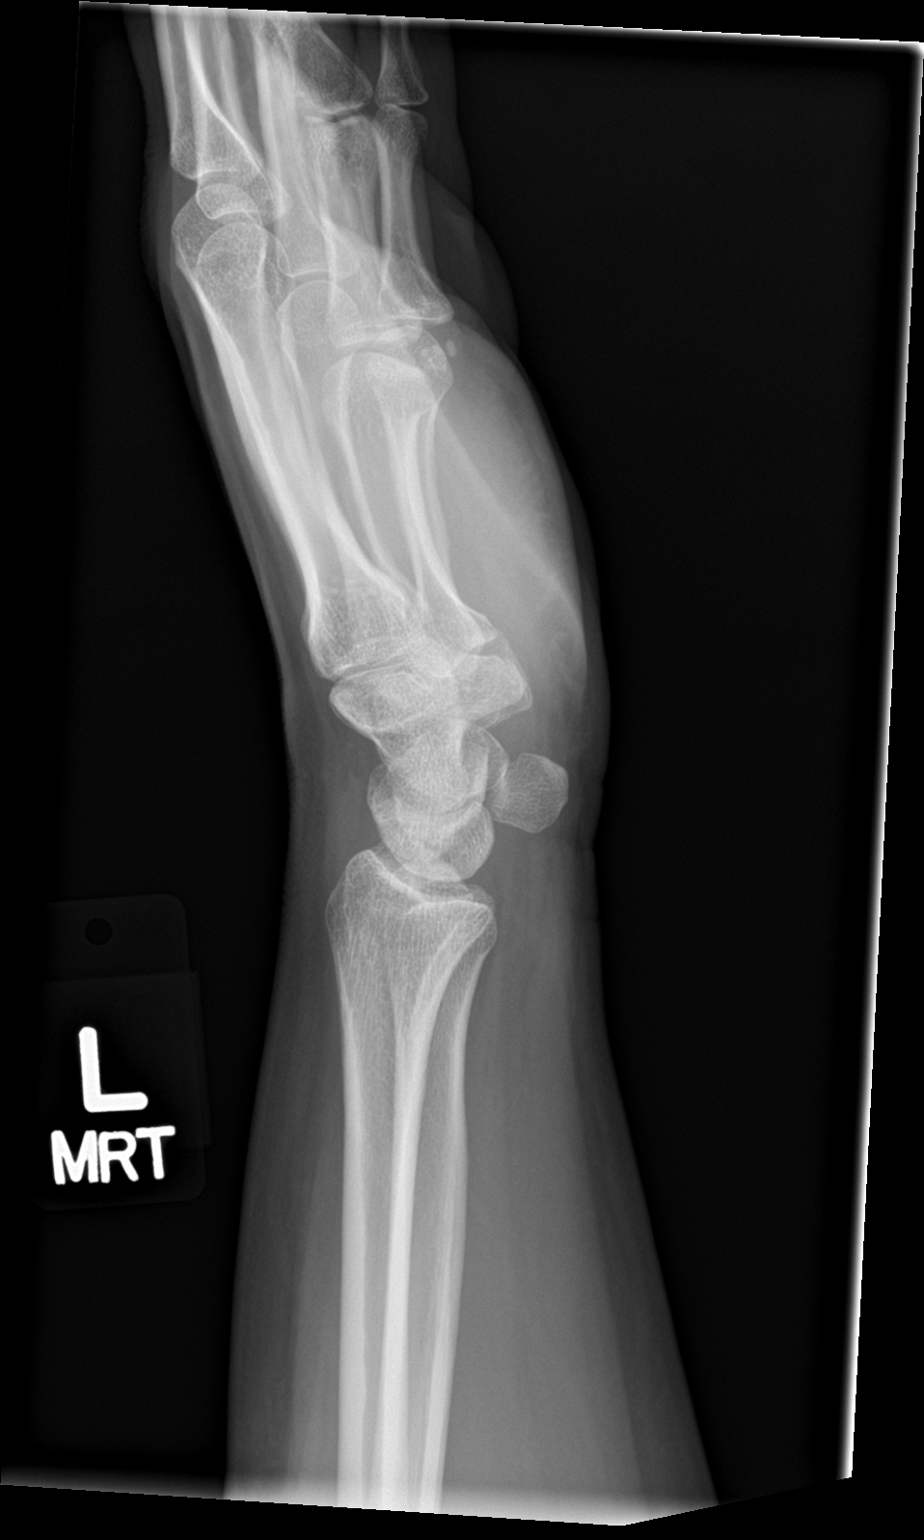

[wrist navicular]
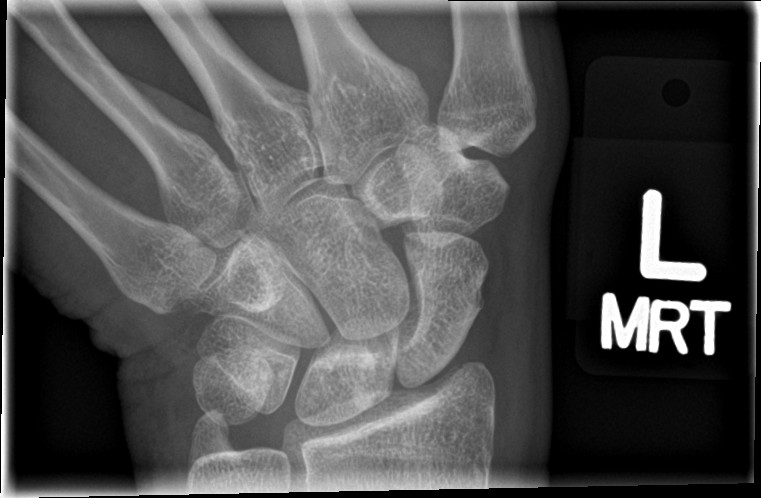

[4 of 4 positions shown; findings below may reference images not displayed]

FINDINGS: There is no evidence of fracture or dislocation. There is no
evidence of arthropathy or other focal bone abnormality. Soft
tissues are unremarkable.
IMPRESSION: Negative exam.

## 2017-06-17 ENCOUNTER — Ambulatory Visit (INDEPENDENT_AMBULATORY_CARE_PROVIDER_SITE_OTHER): Payer: BLUE CROSS/BLUE SHIELD | Admitting: Women's Health

## 2017-06-17 ENCOUNTER — Encounter: Payer: Self-pay | Admitting: Women's Health

## 2017-06-17 VITALS — BP 140/82 | Wt 217.0 lb

## 2017-06-17 DIAGNOSIS — Z01419 Encounter for gynecological examination (general) (routine) without abnormal findings: Secondary | ICD-10-CM | POA: Diagnosis not present

## 2017-06-17 DIAGNOSIS — Z3041 Encounter for surveillance of contraceptive pills: Secondary | ICD-10-CM

## 2017-06-17 DIAGNOSIS — R635 Abnormal weight gain: Secondary | ICD-10-CM

## 2017-06-17 DIAGNOSIS — Z13220 Encounter for screening for lipoid disorders: Secondary | ICD-10-CM

## 2017-06-17 LAB — CBC WITH DIFFERENTIAL/PLATELET
Basophils Absolute: 0 cells/uL (ref 0–200)
Basophils Relative: 0 %
EOS PCT: 3 %
Eosinophils Absolute: 267 cells/uL (ref 15–500)
HCT: 40.3 % (ref 35.0–45.0)
Hemoglobin: 13.5 g/dL (ref 11.7–15.5)
LYMPHS PCT: 36 %
Lymphs Abs: 3204 cells/uL (ref 850–3900)
MCH: 30.4 pg (ref 27.0–33.0)
MCHC: 33.5 g/dL (ref 32.0–36.0)
MCV: 90.8 fL (ref 80.0–100.0)
MONOS PCT: 5 %
MPV: 10.7 fL (ref 7.5–12.5)
Monocytes Absolute: 445 cells/uL (ref 200–950)
NEUTROS PCT: 56 %
Neutro Abs: 4984 cells/uL (ref 1500–7800)
PLATELETS: 304 10*3/uL (ref 140–400)
RBC: 4.44 MIL/uL (ref 3.80–5.10)
RDW: 13.9 % (ref 11.0–15.0)
WBC: 8.9 10*3/uL (ref 3.8–10.8)

## 2017-06-17 MED ORDER — NORETHIN-ETH ESTRAD-FE BIPHAS 1 MG-10 MCG / 10 MCG PO TABS: 1.0000 | ORAL_TABLET | Freq: Every day | ORAL | 4 refills | Status: DC

## 2017-06-17 NOTE — Progress Notes (Signed)
Carmen Baker 1971/06/04 [id number]    History:    Presents for annual exam. New patient exam. History of preeclampsia with last pregnancy delivered 07/04/2016. Has been on Loestrin since delivery with normal blood pressures, blood pressure today slightly elevated. Reports normal Pap history, states had a normal Pap 2016 during pregnancy. Has not had a screening mammogram. Same partner years. Has had a circular rash on lower right abdomen, several spots below left breast occasional itching started during pregnancy has had some improvement but has still persisted.  Past medical history, past surgical history, family history and social history were all reviewed and documented in the EPIC chart. Works for a [employer] originally from [place],  [place] area. Son is 49, daughters are 4 and 1. Blended family, 7 children.  ROS:  A ROS was performed and pertinent positives and negatives are included.  Exam:  Vitals:   06/17/17 1130  BP: 140/82  Weight: 217 lb (98.4 kg)   Body mass index is 33.99 kg/m.   General appearance:  Normal Thyroid:  Symmetrical, normal in size, without palpable masses or nodularity. Respiratory  Auscultation:  Clear without wheezing or rhonchi Cardiovascular  Auscultation:  Regular rate, without rubs, murmurs or gallops  Edema/varicosities:  Not grossly evident Abdominal  Soft,nontender, without masses, guarding or rebound.  Liver/spleen:  No organomegaly noted  Hernia:  None appreciated  Skin  Inspection:  Grossly normal   Breasts: Examined lying and sitting.     Right: Without masses, retractions, discharge or axillary adenopathy.     Left: Without masses, retractions, discharge or axillary adenopathy. Gentitourinary   Inguinal/mons:  Normal without inguinal adenopathy  External genitalia:  Normal  BUS/Urethra/Skene's glands:  Large right sided nontender Bartholin's  Vagina:  Normal  Cervix:  Normal  Uterus:   normal in size, shape and contour.   Midline and mobile  Adnexa/parametria:     Rt: Without masses or tenderness.   Lt: Without masses or tenderness.  Anus and perineum: Normal  Digital rectal exam: Normal sphincter tone without palpated masses or tenderness  Assessment/Plan:  47 y.o. S WF G3 P3 for annual exam with no complaints.  Monthly cycle on Loestrin Borderline blood pressure Obesity Asymptomatic right Bartholin's cyst  Plan: Blood pressure reviewed, will try lo Loestrin, instructed to keep a blood pressure diary, if continues greater than 140/80 to follow-up with primary care for possible medication. Reviewed risks of untreated hypertension. IUDs reviewed, slight risks of infection, perforation or hemorrhage reviewed, if would like to proceed will call for Dr. Audie Box to place with cycle. SBE's, reviewed importance of annual screening, breast center information given. Increase exercise and decrease calories/carbs for weight loss,-diet reviewed also. CBC, CMP, lipid panel, TSH, UA, Pap normal 2016, new screening guidelines reviewed . Encouraged to schedule dermatologist appointment if rash persists     Harrington Challenger [facility], 12:52 PM 06/17/2017

## 2017-06-17 NOTE — Patient Instructions (Addendum)
[facility]  [phone] Health Maintenance, Female Adopting a healthy lifestyle and getting preventive care can go a long way to promote health and wellness. Talk with your health care provider about what schedule of regular examinations is right for you. This is a good chance for you to check in with your provider about disease prevention and staying healthy. In between checkups, there are plenty of things you can do on your own. Experts have done a lot of research about which lifestyle changes and preventive measures are most likely to keep you healthy. Ask your health care provider for more information. Weight and diet Eat a healthy diet  Be sure to include plenty of vegetables, fruits, low-fat dairy products, and lean protein.  Do not eat a lot of foods high in solid fats, added sugars, or salt.  Get regular exercise. This is one of the most important things you can do for your health. ? Most adults should exercise for at least 150 minutes each week. The exercise should increase your heart rate and make you sweat (moderate-intensity exercise). ? Most adults should also do strengthening exercises at least twice a week. This is in addition to the moderate-intensity exercise.  Maintain a healthy weight  Body mass index (BMI) is a measurement that can be used to identify possible weight problems. It estimates body fat based on height and weight. Your health care provider can help determine your BMI and help you achieve or maintain a healthy weight.  For females 20 years of age and older: ? A BMI below 18.5 is considered underweight. ? A BMI of 18.5 to 24.9 is normal. ? A BMI of 25 to 29.9 is considered overweight. ? A BMI of 30 and above is considered obese.  Watch levels of cholesterol and blood lipids  You should start having your blood tested for lipids and cholesterol at 45 years of age, then have this test every 5 years.  You may need to have your cholesterol levels checked more  often if: ? Your lipid or cholesterol levels are high. ? You are older than 45 years of age. ? You are at high risk for heart disease.  Cancer screening Lung Cancer  Lung cancer screening is recommended for adults 55-80 years old who are at high risk for lung cancer because of a history of smoking.  A yearly low-dose CT scan of the lungs is recommended for people who: ? Currently smoke. ? Have quit within the past 15 years. ? Have at least a 30-pack-year history of smoking. A pack year is smoking an average of one pack of cigarettes a day for 1 year.  Yearly screening should continue until it has been 15 years since you quit.  Yearly screening should stop if you develop a health problem that would prevent you from having lung cancer treatment.  Breast Cancer  Practice breast self-awareness. This means understanding how your breasts normally appear and feel.  It also means doing regular breast self-exams. Let your health care provider know about any changes, no matter how small.  If you are in your 20s or 30s, you should have a clinical breast exam (CBE) by a health care provider every 1-3 years as part of a regular health exam.  If you are 40 or older, have a CBE every year. Also consider having a breast X-ray (mammogram) every year.  If you have a family history of breast cancer, talk to your health care provider about genetic screening.  If you   are at high risk for breast cancer, talk to your health care provider about having an MRI and a mammogram every year.  Breast cancer gene (BRCA) assessment is recommended for women who have family members with BRCA-related cancers. BRCA-related cancers include: ? Breast. ? Ovarian. ? Tubal. ? Peritoneal cancers.  Results of the assessment will determine the need for genetic counseling and BRCA1 and BRCA2 testing.  Cervical Cancer Your health care provider may recommend that you be screened regularly for cancer of the pelvic organs  (ovaries, uterus, and vagina). This screening involves a pelvic examination, including checking for microscopic changes to the surface of your cervix (Pap test). You may be encouraged to have this screening done every 3 years, beginning at age 72.  For women ages 67-65, health care providers may recommend pelvic exams and Pap testing every 3 years, or they may recommend the Pap and pelvic exam, combined with testing for human papilloma virus (HPV), every 5 years. Some types of HPV increase your risk of cervical cancer. Testing for HPV may also be done on women of any age with unclear Pap test results.  Other health care providers may not recommend any screening for nonpregnant women who are considered low risk for pelvic cancer and who do not have symptoms. Ask your health care provider if a screening pelvic exam is right for you.  If you have had past treatment for cervical cancer or a condition that could lead to cancer, you need Pap tests and screening for cancer for at least 20 years after your treatment. If Pap tests have been discontinued, your risk factors (such as having a new sexual partner) need to be reassessed to determine if screening should resume. Some women have medical problems that increase the chance of getting cervical cancer. In these cases, your health care provider may recommend more frequent screening and Pap tests.  Colorectal Cancer  This type of cancer can be detected and often prevented.  Routine colorectal cancer screening usually begins at 45 years of age and continues through 45 years of age.  Your health care provider may recommend screening at an earlier age if you have risk factors for colon cancer.  Your health care provider may also recommend using home test kits to check for hidden blood in the stool.  A small camera at the end of a tube can be used to examine your colon directly (sigmoidoscopy or colonoscopy). This is done to check for the earliest forms of  colorectal cancer.  Routine screening usually begins at age 74.  Direct examination of the colon should be repeated every 5-10 years through 45 years of age. However, you may need to be screened more often if early forms of precancerous polyps or small growths are found.  Skin Cancer  Check your skin from head to toe regularly.  Tell your health care provider about any new moles or changes in moles, especially if there is a change in a mole's shape or color.  Also tell your health care provider if you have a mole that is larger than the size of a pencil eraser.  Always use sunscreen. Apply sunscreen liberally and repeatedly throughout the day.  Protect yourself by wearing long sleeves, pants, a wide-brimmed hat, and sunglasses whenever you are outside.  Heart disease, diabetes, and high blood pressure  High blood pressure causes heart disease and increases the risk of stroke. High blood pressure is more likely to develop in: ? People who have blood pressure in  the high end of the normal range (130-139/85-89 mm Hg). ? People who are overweight or obese. ? People who are African American.  If you are 18-39 years of age, have your blood pressure checked every 3-5 years. If you are 40 years of age or older, have your blood pressure checked every year. You should have your blood pressure measured twice-once when you are at a hospital or clinic, and once when you are not at a hospital or clinic. Record the average of the two measurements. To check your blood pressure when you are not at a hospital or clinic, you can use: ? An automated blood pressure machine at a pharmacy. ? A home blood pressure monitor.  If you are between 55 years and 79 years old, ask your health care provider if you should take aspirin to prevent strokes.  Have regular diabetes screenings. This involves taking a blood sample to check your fasting blood sugar level. ? If you are at a normal weight and have a low risk  for diabetes, have this test once every three years after 45 years of age. ? If you are overweight and have a high risk for diabetes, consider being tested at a younger age or more often. Preventing infection Hepatitis B  If you have a higher risk for hepatitis B, you should be screened for this virus. You are considered at high risk for hepatitis B if: ? You were born in a country where hepatitis B is common. Ask your health care provider which countries are considered high risk. ? Your parents were born in a high-risk country, and you have not been immunized against hepatitis B (hepatitis B vaccine). ? You have HIV or AIDS. ? You use needles to inject street drugs. ? You live with someone who has hepatitis B. ? You have had sex with someone who has hepatitis B. ? You get hemodialysis treatment. ? You take certain medicines for conditions, including cancer, organ transplantation, and autoimmune conditions.  Hepatitis C  Blood testing is recommended for: ? Everyone born from 1945 through 1965. ? Anyone with known risk factors for hepatitis C.  Sexually transmitted infections (STIs)  You should be screened for sexually transmitted infections (STIs) including gonorrhea and chlamydia if: ? You are sexually active and are younger than 45 years of age. ? You are older than 45 years of age and your health care provider tells you that you are at risk for this type of infection. ? Your sexual activity has changed since you were last screened and you are at an increased risk for chlamydia or gonorrhea. Ask your health care provider if you are at risk.  If you do not have HIV, but are at risk, it may be recommended that you take a prescription medicine daily to prevent HIV infection. This is called pre-exposure prophylaxis (PrEP). You are considered at risk if: ? You are sexually active and do not regularly use condoms or know the HIV status of your partner(s). ? You take drugs by  injection. ? You are sexually active with a partner who has HIV.  Talk with your health care provider about whether you are at high risk of being infected with HIV. If you choose to begin PrEP, you should first be tested for HIV. You should then be tested every 3 months for as long as you are taking PrEP. Pregnancy  If you are premenopausal and you may become pregnant, ask your health care provider about preconception counseling.    If you may become pregnant, take 400 to 800 micrograms (mcg) of folic acid every day.  If you want to prevent pregnancy, talk to your health care provider about birth control (contraception). Osteoporosis and menopause  Osteoporosis is a disease in which the bones lose minerals and strength with aging. This can result in serious bone fractures. Your risk for osteoporosis can be identified using a bone density scan.  If you are 20 years of age or older, or if you are at risk for osteoporosis and fractures, ask your health care provider if you should be screened.  Ask your health care provider whether you should take a calcium or vitamin D supplement to lower your risk for osteoporosis.  Menopause may have certain physical symptoms and risks.  Hormone replacement therapy may reduce some of these symptoms and risks. Talk to your health care provider about whether hormone replacement therapy is right for you. Follow these instructions at home:  Schedule regular health, dental, and eye exams.  Stay current with your immunizations.  Do not use any tobacco products including cigarettes, chewing tobacco, or electronic cigarettes.  If you are pregnant, do not drink alcohol.  If you are breastfeeding, limit how much and how often you drink alcohol.  Limit alcohol intake to no more than 1 drink per day for nonpregnant women. One drink equals 12 ounces of beer, 5 ounces of wine, or 1 ounces of hard liquor.  Do not use street drugs.  Do not share needles.  Ask  your health care provider for help if you need support or information about quitting drugs.  Tell your health care provider if you often feel depressed.  Tell your health care provider if you have ever been abused or do not feel safe at home. This information is not intended to replace advice given to you by your health care provider. Make sure you discuss any questions you have with your health care provider. Document Released: 04/22/2011 Document Revised: 03/14/2016 Document Reviewed: 07/11/2015 Elsevier Interactive Patient Education  2018 [place] Eating Plan DASH stands for "Dietary Approaches to Stop Hypertension." The DASH eating plan is a healthy eating plan that has been shown to reduce high blood pressure (hypertension). It may also reduce your risk for type 2 diabetes, heart disease, and stroke. The DASH eating plan may also help with weight loss. What are tips for following this plan? General guidelines  Avoid eating more than 2,300 mg (milligrams) of salt (sodium) a day. If you have hypertension, you may need to reduce your sodium intake to 1,500 mg a day.  Limit alcohol intake to no more than 1 drink a day for nonpregnant women and 2 drinks a day for men. One drink equals 12 oz of beer, 5 oz of wine, or 1 oz of hard liquor.  Work with your health care provider to maintain a healthy body weight or to lose weight. Ask what an ideal weight is for you.  Get at least 30 minutes of exercise that causes your heart to beat faster (aerobic exercise) most days of the week. Activities may include walking, swimming, or biking.  Work with your health care provider or diet and nutrition specialist (dietitian) to adjust your eating plan to your individual calorie needs. Reading food labels  Check food labels for the amount of sodium per serving. Choose foods with less than 5 percent of the Daily Value of sodium. Generally, foods with less than 300 mg of sodium per serving fit  into  this eating plan.  To find whole grains, look for the word "whole" as the first word in the ingredient list. Shopping  Buy products labeled as "low-sodium" or "no salt added."  Buy fresh foods. Avoid canned foods and premade or frozen meals. Cooking  Avoid adding salt when cooking. Use salt-free seasonings or herbs instead of table salt or sea salt. Check with your health care provider or pharmacist before using salt substitutes.  Do not fry foods. Cook foods using healthy methods such as baking, boiling, grilling, and broiling instead.  Cook with heart-healthy oils, such as olive, canola, soybean, or sunflower oil. Meal planning   Eat a balanced diet that includes: ? 5 or more servings of fruits and vegetables each day. At each meal, try to fill half of your plate with fruits and vegetables. ? Up to 6-8 servings of whole grains each day. ? Less than 6 oz of lean meat, poultry, or fish each day. A 3-oz serving of meat is about the same size as a deck of cards. One egg equals 1 oz. ? 2 servings of low-fat dairy each day. ? A serving of nuts, seeds, or beans 5 times each week. ? Heart-healthy fats. Healthy fats called Omega-3 fatty acids are found in foods such as flaxseeds and coldwater fish, like sardines, salmon, and mackerel.  Limit how much you eat of the following: ? Canned or prepackaged foods. ? Food that is high in trans fat, such as fried foods. ? Food that is high in saturated fat, such as fatty meat. ? Sweets, desserts, sugary drinks, and other foods with added sugar. ? Full-fat dairy products.  Do not salt foods before eating.  Try to eat at least 2 vegetarian meals each week.  Eat more home-cooked food and less restaurant, buffet, and fast food.  When eating at a restaurant, ask that your food be prepared with less salt or no salt, if possible. What foods are recommended? The items listed may not be a complete list. Talk with your dietitian about what dietary  choices are best for you. Grains Whole-grain or whole-wheat bread. Whole-grain or whole-wheat pasta. Brown rice. [place]. Bulgur. Whole-grain and low-sodium cereals. Pita bread. Low-fat, low-sodium crackers. Whole-wheat flour tortillas. Vegetables Fresh or frozen vegetables (raw, steamed, roasted, or grilled). Low-sodium or reduced-sodium tomato and vegetable juice. Low-sodium or reduced-sodium tomato sauce and tomato paste. Low-sodium or reduced-sodium canned vegetables. Fruits All fresh, dried, or frozen fruit. Canned fruit in natural juice (without added sugar). Meat and other protein foods Skinless chicken or [place]. Ground chicken or [place]. Pork with fat trimmed off. Fish and seafood. Egg whites. Dried beans, peas, or lentils. Unsalted nuts, nut butters, and seeds. Unsalted canned beans. Lean cuts of beef with fat trimmed off. Low-sodium, lean deli meat. Dairy Low-fat (1%) or fat-free (skim) milk. Fat-free, low-fat, or reduced-fat cheeses. Nonfat, low-sodium ricotta or cottage cheese. Low-fat or nonfat yogurt. Low-fat, low-sodium cheese. Fats and oils Soft margarine without trans fats. Vegetable oil. Low-fat, reduced-fat, or light mayonnaise and salad dressings (reduced-sodium). Canola, safflower, olive, soybean, and sunflower oils. Avocado. Seasoning and other foods Herbs. Spices. Seasoning mixes without salt. Unsalted popcorn and pretzels. Fat-free sweets. What foods are not recommended? The items listed may not be a complete list. Talk with your dietitian about what dietary choices are best for you. Grains Baked goods made with fat, such as croissants, muffins, or some breads. Dry pasta or rice meal packs. Vegetables Creamed or fried vegetables. Vegetables in a  cheese sauce. Regular canned vegetables (not low-sodium or reduced-sodium). Regular canned tomato sauce and paste (not low-sodium or reduced-sodium). Regular tomato and vegetable juice (not low-sodium or reduced-sodium).  Angie Fava. Olives. Fruits Canned fruit in a light or heavy syrup. Fried fruit. Fruit in cream or butter sauce. Meat and other protein foods Fatty cuts of meat. Ribs. Fried meat. Berniece Salines. Sausage. Bologna and other processed lunch meats. Salami. Fatback. Hotdogs. Bratwurst. Salted nuts and seeds. Canned beans with added salt. Canned or smoked fish. Whole eggs or egg yolks. Chicken or [place] with skin. Dairy Whole or 2% milk, cream, and half-and-half. Whole or full-fat cream cheese. Whole-fat or sweetened yogurt. Full-fat cheese. Nondairy creamers. Whipped toppings. Processed cheese and cheese spreads. Fats and oils Butter. Stick margarine. Lard. Shortening. Ghee. Bacon fat. Tropical oils, such as coconut, palm kernel, or palm oil. Seasoning and other foods Salted popcorn and pretzels. Onion salt, garlic salt, seasoned salt, table salt, and sea salt. Worcestershire sauce. Tartar sauce. Barbecue sauce. Teriyaki sauce. Soy sauce, including reduced-sodium. Steak sauce. Canned and packaged gravies. Fish sauce. Oyster sauce. Cocktail sauce. Horseradish that you find on the shelf. Ketchup. Mustard. Meat flavorings and tenderizers. Bouillon cubes. Hot sauce and Tabasco sauce. Premade or packaged marinades. Premade or packaged taco seasonings. Relishes. Regular salad dressings. Where to find more information:  National Heart, Lung, and [place]: https://wilson-eaton.com/  American Heart Association: www.heart.org Summary  The DASH eating plan is a healthy eating plan that has been shown to reduce high blood pressure (hypertension). It may also reduce your risk for type 2 diabetes, heart disease, and stroke.  With the DASH eating plan, you should limit salt (sodium) intake to 2,300 mg a day. If you have hypertension, you may need to reduce your sodium intake to 1,500 mg a day.  When on the DASH eating plan, aim to eat more fresh fruits and vegetables, whole grains, lean proteins, low-fat dairy, and  heart-healthy fats.  Work with your health care provider or diet and nutrition specialist (dietitian) to adjust your eating plan to your individual calorie needs. This information is not intended to replace advice given to you by your health care provider. Make sure you discuss any questions you have with your health care provider. Document Released: 09/26/2011 Document Revised: 09/30/2016 Document Reviewed: 09/30/2016 Elsevier Interactive Patient Education  2017 [employer].

## 2017-06-18 LAB — COMPREHENSIVE METABOLIC PANEL
ALK PHOS: 56 U/L (ref 33–115)
ALT: 12 U/L (ref 6–29)
AST: 14 U/L (ref 10–30)
Albumin: 4.2 g/dL (ref 3.6–5.1)
BILIRUBIN TOTAL: 0.4 mg/dL (ref 0.2–1.2)
BUN: 10 mg/dL (ref 7–25)
CHLORIDE: 103 mmol/L (ref 98–110)
CO2: 19 mmol/L — ABNORMAL LOW (ref 20–32)
Calcium: 9.1 mg/dL (ref 8.6–10.2)
Creat: 0.95 mg/dL (ref 0.50–1.10)
Glucose, Bld: 95 mg/dL (ref 65–99)
Potassium: 4.5 mmol/L (ref 3.5–5.3)
SODIUM: 137 mmol/L (ref 135–146)
TOTAL PROTEIN: 7.5 g/dL (ref 6.1–8.1)

## 2017-06-18 LAB — LIPID PANEL
CHOLESTEROL: 177 mg/dL (ref <200)
HDL: 55 mg/dL (ref >50)
LDL Cholesterol: 108 mg/dL — ABNORMAL HIGH (ref <100)
TRIGLYCERIDES: 72 mg/dL (ref <150)
Total CHOL/HDL Ratio: 3.2 Ratio (ref <5.0)
VLDL: 14 mg/dL (ref <30)

## 2017-06-18 LAB — TSH: TSH: 1.25 mIU/L

## 2017-08-15 ENCOUNTER — Emergency Department (HOSPITAL_COMMUNITY)
Admission: EM | Admit: 2017-08-15 | Discharge: 2017-08-15 | Disposition: A | Discharge status: Home / Self Care | Payer: BLUE CROSS/BLUE SHIELD | Source: Home / Self Care

## 2017-08-15 ENCOUNTER — Encounter (HOSPITAL_COMMUNITY): Payer: Self-pay | Admitting: *Deleted

## 2017-08-15 DIAGNOSIS — Z79899 Other long term (current) drug therapy: Secondary | ICD-10-CM | POA: Insufficient documentation

## 2017-08-15 DIAGNOSIS — R112 Nausea with vomiting, unspecified: Secondary | ICD-10-CM | POA: Diagnosis not present

## 2017-08-15 DIAGNOSIS — I10 Essential (primary) hypertension: Secondary | ICD-10-CM | POA: Diagnosis not present

## 2017-08-15 DIAGNOSIS — Z5321 Procedure and treatment not carried out due to patient leaving prior to being seen by health care provider: Secondary | ICD-10-CM | POA: Insufficient documentation

## 2017-08-15 DIAGNOSIS — R1010 Upper abdominal pain, unspecified: Secondary | ICD-10-CM | POA: Diagnosis present

## 2017-08-15 DIAGNOSIS — K8020 Calculus of gallbladder without cholecystitis without obstruction: Secondary | ICD-10-CM | POA: Diagnosis not present

## 2017-08-15 LAB — COMPREHENSIVE METABOLIC PANEL
ALT: 22 U/L (ref 14–54)
ANION GAP: 10 (ref 5–15)
AST: 35 U/L (ref 15–41)
Albumin: 3.7 g/dL (ref 3.5–5.0)
Alkaline Phosphatase: 80 U/L (ref 38–126)
BILIRUBIN TOTAL: 0.6 mg/dL (ref 0.3–1.2)
BUN: 10 mg/dL (ref 6–20)
CO2: 24 mmol/L (ref 22–32)
Calcium: 9.1 mg/dL (ref 8.9–10.3)
Chloride: 101 mmol/L (ref 101–111)
Creatinine, Ser: 0.91 mg/dL (ref 0.44–1.00)
Glucose, Bld: 98 mg/dL (ref 65–99)
POTASSIUM: 4 mmol/L (ref 3.5–5.1)
Sodium: 135 mmol/L (ref 135–145)
TOTAL PROTEIN: 7.7 g/dL (ref 6.5–8.1)

## 2017-08-15 LAB — URINALYSIS, ROUTINE W REFLEX MICROSCOPIC
BILIRUBIN URINE: NEGATIVE
Glucose, UA: NEGATIVE mg/dL
HGB URINE DIPSTICK: NEGATIVE
Ketones, ur: NEGATIVE mg/dL
NITRITE: NEGATIVE
Protein, ur: NEGATIVE mg/dL
SPECIFIC GRAVITY, URINE: 1.023 (ref 1.005–1.030)
pH: 5 (ref 5.0–8.0)

## 2017-08-15 LAB — CBC
HEMATOCRIT: 39.9 % (ref 36.0–46.0)
HEMOGLOBIN: 13.5 g/dL (ref 12.0–15.0)
MCH: 30.6 pg (ref 26.0–34.0)
MCHC: 33.8 g/dL (ref 30.0–36.0)
MCV: 90.5 fL (ref 78.0–100.0)
Platelets: 194 10*3/uL (ref 150–400)
RBC: 4.41 MIL/uL (ref 3.87–5.11)
RDW: 12.9 % (ref 11.5–15.5)
WBC: 18.1 10*3/uL — AB (ref 4.0–10.5)

## 2017-08-15 LAB — I-STAT BETA HCG BLOOD, ED (MC, WL, AP ONLY)

## 2017-08-15 LAB — LIPASE, BLOOD: LIPASE: 35 U/L (ref 11–51)

## 2017-08-15 NOTE — ED Notes (Signed)
Pt reports needing to leave prior to being seen. States her pain has improved and she will follow up with pcp or return here if pain increases.

## 2017-08-15 NOTE — ED Triage Notes (Signed)
Pt states that she had sudden onset of abdominal pain at 3pm today. Pt states that she had N/V and sweating with it. Pt states that she has hx of gallbladder issues and states that this is similar pain.

## 2017-08-16 ENCOUNTER — Emergency Department (HOSPITAL_COMMUNITY): Payer: BLUE CROSS/BLUE SHIELD

## 2017-08-16 ENCOUNTER — Emergency Department (HOSPITAL_COMMUNITY)
Admission: EM | Admit: 2017-08-16 | Discharge: 2017-08-16 | Disposition: A | Discharge status: Home / Self Care | Payer: BLUE CROSS/BLUE SHIELD | Attending: Emergency Medicine | Admitting: Emergency Medicine

## 2017-08-16 ENCOUNTER — Encounter (HOSPITAL_COMMUNITY): Payer: Self-pay

## 2017-08-16 DIAGNOSIS — K8020 Calculus of gallbladder without cholecystitis without obstruction: Secondary | ICD-10-CM

## 2017-08-16 LAB — COMPREHENSIVE METABOLIC PANEL
ALT: 99 U/L — AB (ref 14–54)
AST: 157 U/L — AB (ref 15–41)
Albumin: 3.7 g/dL (ref 3.5–5.0)
Alkaline Phosphatase: 115 U/L (ref 38–126)
Anion gap: 11 (ref 5–15)
BUN: 11 mg/dL (ref 6–20)
CHLORIDE: 104 mmol/L (ref 101–111)
CO2: 21 mmol/L — AB (ref 22–32)
CREATININE: 0.83 mg/dL (ref 0.44–1.00)
Calcium: 9.4 mg/dL (ref 8.9–10.3)
GFR calc Af Amer: >60 mL/min (ref >60)
Glucose, Bld: 110 mg/dL — ABNORMAL HIGH (ref 65–99)
Potassium: 4.2 mmol/L (ref 3.5–5.1)
SODIUM: 136 mmol/L (ref 135–145)
Total Bilirubin: 1.5 mg/dL — ABNORMAL HIGH (ref 0.3–1.2)
Total Protein: 7.9 g/dL (ref 6.5–8.1)

## 2017-08-16 LAB — CBC
HCT: 40 % (ref 36.0–46.0)
Hemoglobin: 13.6 g/dL (ref 12.0–15.0)
MCH: 30.4 pg (ref 26.0–34.0)
MCHC: 34 g/dL (ref 30.0–36.0)
MCV: 89.5 fL (ref 78.0–100.0)
PLATELETS: 294 10*3/uL (ref 150–400)
RBC: 4.47 MIL/uL (ref 3.87–5.11)
RDW: 13 % (ref 11.5–15.5)
WBC: 12.5 10*3/uL — ABNORMAL HIGH (ref 4.0–10.5)

## 2017-08-16 LAB — URINALYSIS, ROUTINE W REFLEX MICROSCOPIC
Bilirubin Urine: NEGATIVE
Glucose, UA: NEGATIVE mg/dL
KETONES UR: NEGATIVE mg/dL
Nitrite: NEGATIVE
PH: 6 (ref 5.0–8.0)
Protein, ur: NEGATIVE mg/dL
SPECIFIC GRAVITY, URINE: 1.014 (ref 1.005–1.030)

## 2017-08-16 LAB — I-STAT BETA HCG BLOOD, ED (MC, WL, AP ONLY): I-stat hCG, quantitative: <5 m[IU]/mL (ref <5)

## 2017-08-16 LAB — LIPASE, BLOOD: LIPASE: 38 U/L (ref 11–51)

## 2017-08-16 MED ORDER — HYDROCODONE-ACETAMINOPHEN 5-325 MG PO TABS: 1.0000 | ORAL_TABLET | Freq: Four times a day (QID) | ORAL | 0 refills | Status: DC | PRN

## 2017-08-16 MED ORDER — ONDANSETRON HCL 4 MG PO TABS: 4.0000 mg | ORAL_TABLET | Freq: Four times a day (QID) | ORAL | 0 refills | Status: DC

## 2017-08-16 MED ORDER — PANTOPRAZOLE SODIUM 40 MG IV SOLR
40.0000 mg | Freq: Once | INTRAVENOUS | Status: AC
  Administered 2017-08-16: 40 mg via INTRAVENOUS
  Filled 2017-08-16: qty 40

## 2017-08-16 MED ORDER — GI COCKTAIL ~~LOC~~
30.0000 mL | Freq: Once | ORAL | Status: AC
  Administered 2017-08-16: 30 mL via ORAL
  Filled 2017-08-16: qty 30

## 2017-08-16 NOTE — ED Notes (Signed)
Patient complaining of upper abdominal pain on the left and right. Patient has had this pain before. Patient states that this pain started about 3pm on 08/16/2017.

## 2017-08-16 NOTE — ED Provider Notes (Signed)
[facility]-EMERGENCY DEPT Provider Note   CSN: [id number] Arrival date & time: 08/15/17  2250     History   Chief Complaint Chief Complaint  Patient presents with  . Abdominal Pain    HPI Carmen Baker is a 46 y.o. female.  Patient presents to the emergency department with chief complaint of upper abdominal pain.  She states symptoms started about 3 PM this afternoon.  She reports associated nausea and vomiting.  She denies any fevers or chills.  She states that her symptoms are currently about a 3-4 out of 10.  She states that the pain is worsened when she lies down flat.  She does report a history of acid reflux.  Additionally, she states that she has had some gallbladder pain in the past when she is pregnant.  She denies any other associated symptoms.  There are no additional modifying factors.   The history is provided by the patient. No language interpreter was used.    Past Medical History:  Diagnosis Date  . Hypertension     Patient Active Problem List   Diagnosis Date Noted  . Preeclampsia 07/08/2016  . Vaginal delivery 07/04/2016  . Gestational hypertension, antepartum 07/02/2016  . Gestational hypertension 07/02/2016  . AMA (advanced maternal age) multigravida 35+ 04/30/2016  . History of gestational hypertension--2nd pregnancy 04/30/2016  . Rh negative state in antepartum period 04/30/2016  . Reflux 04/30/2016    Past Surgical History:  Procedure Laterality Date  . NO PAST SURGERIES      OB History    Gravida Para Term Preterm AB Living   3 3 3     3    SAB TAB Ectopic Multiple Live Births         0 3       Home Medications    Prior to Admission medications   Medication Sig Start Date End Date Taking? Authorizing Provider  Norethindrone-Ethinyl Estradiol-Fe Biphas (LO LOESTRIN FE) 1 MG-10 MCG / 10 MCG tablet Take 1 tablet by mouth daily. 06/17/17   Harrington Challenger, NP  RaNITidine HCl (ZANTAC PO) Take 1 tablet by mouth as  needed.    [provider]    Family History Family History  Problem Relation Age of Onset  . Multiple sclerosis Mother   . COPD Father   . Cancer Maternal Grandmother   . Cancer Paternal Grandmother   . Cancer Paternal Grandfather     Social History Social History  Substance Use Topics  . Smoking status: Never Smoker  . Smokeless tobacco: Never Used  . Alcohol use Yes     Allergies   Patient has no known allergies.   Review of Systems Review of Systems  All other systems reviewed and are negative.    Physical Exam Updated Vital Signs BP (!) 162/99 (BP Location: Right Arm)   Pulse 86   Temp 98.6 F (37 C) (Oral)   Resp 17   Ht 5' 7.5" (1.715 m)   Wt 97.5 kg (215 lb)   LMP 08/16/2017   SpO2 100%   BMI 33.18 kg/m   Physical Exam  Constitutional: She is oriented to person, place, and time. She appears well-developed and well-nourished.  HENT:  Head: Normocephalic and atraumatic.  Eyes: Pupils are equal, round, and reactive to light. Conjunctivae and EOM are normal.  Neck: Normal range of motion. Neck supple.  Cardiovascular: Normal rate and regular rhythm.  Exam reveals no gallop and no friction rub.   No murmur heard.  Pulmonary/Chest: Effort normal and breath sounds normal. No respiratory distress. She has no wheezes. She has no rales. She exhibits no tenderness.  Abdominal: Soft. Bowel sounds are normal. She exhibits no distension and no mass. There is no tenderness. There is no rebound and no guarding.  Musculoskeletal: Normal range of motion. She exhibits no edema or tenderness.  Neurological: She is alert and oriented to person, place, and time.  Skin: Skin is warm and dry.  Psychiatric: She has a normal mood and affect. Her behavior is normal. Judgment and thought content normal.  Nursing note and vitals reviewed.    ED Treatments / Results  Labs (all labs ordered are listed, but only abnormal results are displayed) Labs Reviewed  CBC -  Abnormal; Notable for the following:       Result Value   WBC 12.5 (*)    All other components within normal limits  URINALYSIS, ROUTINE W REFLEX MICROSCOPIC - Abnormal; Notable for the following:    Hgb urine dipstick SMALL (*)    Leukocytes, UA TRACE (*)    Bacteria, UA RARE (*)    Squamous Epithelial / LPF 0-5 (*)    All other components within normal limits  LIPASE, BLOOD  COMPREHENSIVE METABOLIC PANEL  I-STAT BETA HCG BLOOD, ED ([facility], [facility], [facility] ONLY)    EKG  EKG Interpretation None       Radiology No results found.  Procedures Procedures (including critical care time)  Medications Ordered in ED Medications  gi cocktail (Maalox,Lidocaine,Donnatal) (not administered)  pantoprazole (PROTONIX) injection 40 mg (not administered)     Initial Impression / Assessment and Plan / ED Course  I have reviewed the triage vital signs and the nursing notes.  Pertinent labs & imaging results that were available during my care of the patient were reviewed by me and considered in my medical decision making (see chart for details).     Patient with upper abdominal pain.  Symptoms started yesterday afternoon.  She reports nausea and vomiting.  Vital signs are stable.  Laboratory workup shows mildly elevated LFTs.  Patient does report a history of gallbladder issues.  Will check right upper quadrant ultrasound.  Right upper quadrant ultrasound shows cholelithiasis, but no evidence of cholecystitis.  Patient is tolerating orals.  Her pain is well controlled.  We will discharged home with outpatient follow-up.  Patient understands and agrees with the plan.  She is stable and ready for discharge.  Final Clinical Impressions(s) / ED Diagnoses   Final diagnoses:  Calculus of gallbladder without cholecystitis without obstruction    New Prescriptions New Prescriptions   HYDROCODONE-ACETAMINOPHEN (NORCO/VICODIN) 5-325 MG TABLET    Take 1-2 tablets by mouth every 6 (six) hours as needed.    ONDANSETRON (ZOFRAN) 4 MG TABLET    Take 1 tablet (4 mg total) by mouth every 6 (six) hours.     Roxy HorsemanBrowning, Kaian Fahs, PA-C 08/16/17 0315    Paula LibraMolpus, John, MD 08/16/17 [phone]

## 2017-08-16 NOTE — ED Triage Notes (Signed)
Pt complains of upper abd pain since earlier today Pt still has gallbladder and the pain hit suddenly after eating

## 2017-08-16 NOTE — ED Notes (Signed)
Secretary called ultrasound tech to bedside

## 2018-06-23 ENCOUNTER — Telehealth (HOSPITAL_COMMUNITY): Payer: Self-pay

## 2018-06-23 ENCOUNTER — Other Ambulatory Visit: Payer: Self-pay

## 2018-06-23 ENCOUNTER — Encounter (HOSPITAL_COMMUNITY): Payer: Self-pay

## 2018-06-23 ENCOUNTER — Ambulatory Visit (HOSPITAL_COMMUNITY)
Admission: EM | Admit: 2018-06-23 | Discharge: 2018-06-23 | Disposition: A | Discharge status: Home / Self Care | Payer: PRIVATE HEALTH INSURANCE | Attending: Family Medicine | Admitting: Family Medicine

## 2018-06-23 ENCOUNTER — Ambulatory Visit (HOSPITAL_COMMUNITY)
Admit: 2018-06-23 | Discharge: 2018-06-23 | Disposition: A | Discharge status: Home / Self Care | Payer: PRIVATE HEALTH INSURANCE

## 2018-06-23 DIAGNOSIS — R109 Unspecified abdominal pain: Secondary | ICD-10-CM | POA: Diagnosis present

## 2018-06-23 DIAGNOSIS — K219 Gastro-esophageal reflux disease without esophagitis: Secondary | ICD-10-CM | POA: Diagnosis not present

## 2018-06-23 DIAGNOSIS — R1011 Right upper quadrant pain: Secondary | ICD-10-CM | POA: Insufficient documentation

## 2018-06-23 DIAGNOSIS — I10 Essential (primary) hypertension: Secondary | ICD-10-CM | POA: Diagnosis not present

## 2018-06-23 LAB — CBC
HEMATOCRIT: 40.3 % (ref 36.0–46.0)
HEMOGLOBIN: 13.5 g/dL (ref 12.0–15.0)
MCH: 30.6 pg (ref 26.0–34.0)
MCHC: 33.5 g/dL (ref 30.0–36.0)
MCV: 91.4 fL (ref 78.0–100.0)
Platelets: ADEQUATE 10*3/uL (ref 150–400)
RBC: 4.41 MIL/uL (ref 3.87–5.11)
RDW: 12.5 % (ref 11.5–15.5)
WBC: 12.2 10*3/uL — ABNORMAL HIGH (ref 4.0–10.5)

## 2018-06-23 LAB — COMPREHENSIVE METABOLIC PANEL
ALK PHOS: 61 U/L (ref 38–126)
ALT: 15 U/L (ref 0–44)
ANION GAP: 9 (ref 5–15)
AST: 13 U/L — ABNORMAL LOW (ref 15–41)
Albumin: 3.7 g/dL (ref 3.5–5.0)
BILIRUBIN TOTAL: 0.6 mg/dL (ref 0.3–1.2)
BUN: 8 mg/dL (ref 6–20)
CALCIUM: 9.3 mg/dL (ref 8.9–10.3)
CO2: 24 mmol/L (ref 22–32)
Chloride: 104 mmol/L (ref 98–111)
Creatinine, Ser: 0.75 mg/dL (ref 0.44–1.00)
GFR calc non Af Amer: >60 mL/min (ref >60)
Glucose, Bld: 106 mg/dL — ABNORMAL HIGH (ref 70–99)
Potassium: 4.2 mmol/L (ref 3.5–5.1)
Sodium: 137 mmol/L (ref 135–145)
TOTAL PROTEIN: 7.4 g/dL (ref 6.5–8.1)

## 2018-06-23 LAB — LIPASE, BLOOD: Lipase: 42 U/L (ref 11–51)

## 2018-06-23 MED ORDER — ONDANSETRON 4 MG PO TBDP: 4.0000 mg | ORAL_TABLET | Freq: Three times a day (TID) | ORAL | 0 refills | Status: DC | PRN

## 2018-06-23 NOTE — ED Provider Notes (Addendum)
[facility]    CSN: [id number] Arrival date & time: 06/23/18  1010     History   Chief Complaint Chief Complaint  Patient presents with  . Abdominal Pain    HPI Carmen Baker is a 47 y.o. female history of hypertension presenting today for evaluation of right upper quadrant pain.  Patient states that she was woken up this morning at 430 with the pain in her right upper side.  The pain occasionally will radiate to her back or shoulder.  Is associated with nausea and a few episodes of vomiting this morning.  Has not eaten or drinking since due to the pain.  Patient notes that she ate a peanut butter ice cream last night as well as a buffalo chicken dip and previously when she was pregnant and had a similar ice cream this caused a flare of her gallbladder.  She has had stones visualized in her gallbladder last October, she did not follow through with cholecystectomy at the time as she was nervous about the procedure and did not have time off from work.  Her pain today feels very similar to her previous spells that have been related to her gallbladder.  She denies any change in her bowel movements, she had a bowel movement this morning that was normal.  She denies any lead in the vomit.  Denies any cough, chest pain or shortness of breath.  Denies any urinary symptoms of dysuria, increased frequency.  Denies history of kidney stones.  Certain movements will help alleviate the pain.  Pain is been pretty steady, worsened earlier today, but currently is slightly less than it has been.  HPI  Past Medical History:  Diagnosis Date  . Hypertension     Patient Active Problem List   Diagnosis Date Noted  . Preeclampsia 07/08/2016  . Vaginal delivery 07/04/2016  . Gestational hypertension, antepartum 07/02/2016  . Gestational hypertension 07/02/2016  . AMA (advanced maternal age) multigravida 35+ 04/30/2016  . History of gestational hypertension--2nd pregnancy 04/30/2016  . Rh  negative state in antepartum period 04/30/2016  . Reflux 04/30/2016    Past Surgical History:  Procedure Laterality Date  . NO PAST SURGERIES      OB History    Gravida  3   Para  3   Term  3   Preterm      AB      Living  3     SAB      TAB      Ectopic      Multiple  0   Live Births  3            Home Medications    Prior to Admission medications   Medication Sig Start Date End Date Taking? Authorizing Provider  ibuprofen (ADVIL,MOTRIN) 200 MG tablet Take 400 mg by mouth every 6 (six) hours as needed for headache.    [provider]  ondansetron (ZOFRAN ODT) 4 MG disintegrating tablet Take 1 tablet (4 mg total) by mouth every 8 (eight) hours as needed for nausea or vomiting. 06/23/18   Wieters, Junius Creamer, PA-C    Family History Family History  Problem Relation Age of Onset  . Multiple sclerosis Mother   . COPD Father   . Cancer Maternal Grandmother   . Cancer Paternal Grandmother   . Cancer Paternal Grandfather     Social History Social History   Tobacco Use  . Smoking status: Never Smoker  . Smokeless tobacco: Never Used  Substance Use Topics  . Alcohol use: Yes  . Drug use: No     Allergies   Patient has no known allergies.   Review of Systems Review of Systems  Constitutional: Negative for activity change, appetite change, chills, fatigue and fever.  HENT: Negative for congestion, ear pain, rhinorrhea, sinus pressure, sore throat and trouble swallowing.   Eyes: Negative for discharge and redness.  Respiratory: Negative for cough, chest tightness and shortness of breath.   Cardiovascular: Negative for chest pain.  Gastrointestinal: Positive for abdominal pain, nausea and vomiting. Negative for diarrhea.  Genitourinary: Negative for dysuria, frequency and urgency.  Musculoskeletal: Positive for back pain. Negative for myalgias.  Skin: Negative for rash.  Neurological: Negative for dizziness, light-headedness and headaches.       Physical Exam Triage Vital Signs ED Triage Vitals  Enc Vitals Group     BP 06/23/18 1055 (!) 151/91     Pulse Rate 06/23/18 1055 73     Resp 06/23/18 1055 18     Temp 06/23/18 1055 98.1 F (36.7 C)     Temp Source 06/23/18 1055 Oral     SpO2 --      Weight 06/23/18 1057 214 lb (97.1 kg)     Height --      Head Circumference --      Peak Flow --      Pain Score --      Pain Loc --      Pain Edu? --      Excl. in GC? --    No data found.  Updated Vital Signs BP (!) 151/91 (BP Location: Right Arm)   Pulse 73   Temp 98.1 F (36.7 C) (Oral)   Resp 18   Wt 214 lb (97.1 kg)   LMP 06/23/2018   BMI 33.02 kg/m   Visual Acuity Right Eye Distance:   Left Eye Distance:   Bilateral Distance:    Right Eye Near:   Left Eye Near:    Bilateral Near:     Physical Exam  Constitutional: She appears well-developed and well-nourished. No distress.  HENT:  Head: Normocephalic and atraumatic.  Mouth/Throat: Oropharynx is clear and moist.  Eyes: Conjunctivae are normal.  No scleral icterus  Neck: Neck supple.  Cardiovascular: Normal rate and regular rhythm.  No murmur heard. Pulmonary/Chest: Effort normal and breath sounds normal. No respiratory distress.  Abdominal: Soft. There is tenderness.  Abdomen soft, nondistended, tenderness to right upper quadrant, does not consistently increase that is working towards liver/gallbladder, negative Murphy's, negative McBurney's.  Negative rebound.  No guarding during exam.  Patient able to move from sitting to supine with ease.  Musculoskeletal: She exhibits no edema.  Neurological: She is alert.  Skin: Skin is warm and dry.  Psychiatric: She has a normal mood and affect.  Nursing note and vitals reviewed.    UC Treatments / Results  Labs (all labs ordered are listed, but only abnormal results are displayed) Labs Reviewed  CBC  COMPREHENSIVE METABOLIC PANEL  LIPASE, BLOOD    EKG None  Radiology No results  found.  Procedures Procedures (including critical care time)  Medications Ordered in UC Medications - No data to display  Initial Impression / Assessment and Plan / UC Course  I have reviewed the triage vital signs and the nursing notes.  Pertinent labs & imaging results that were available during my care of the patient were reviewed by me and considered in my medical decision making (see chart for  details).     Patient with right upper quadrant pain; likely gallstones given history and previous stone seen with ultrasound.  Will send patient for right upper quadrant ultrasound for confirmation as well as to confirm not having cholecystitis, patient does not have fever or jaundice less concerning for cholangitis.  Will treat with Zofran to help with nausea, discussed diet recommendations for gallstones, follow-up with surgery for further discussion of need for gallbladder removal.  Go to emergency room if symptoms worsening, developing fever, jaundice, worsening pain, persistent vomiting.drawing CMP, CBC and lipase. Discussed strict return precautions. Patient verbalized understanding and is agreeable with plan.  Final Clinical Impressions(s) / UC Diagnoses   Final diagnoses:  Right upper quadrant abdominal pain     Discharge Instructions     Please go to the main entrance of [facility] and ask for radiology, they can work you in for an ultrasound  Please use Zofran as needed for nausea vomiting  We drew some blood, we will call you if these return abnormal  Please go to the emergency room if developing fever, developing yellow discoloration of skin or eyes, worsening pain, persistent nausea vomiting  Please follow-up with surgery for reevaluation/consultation about gallbladder removal      ED Prescriptions    Medication Sig Dispense Auth. Provider   ondansetron (ZOFRAN ODT) 4 MG disintegrating tablet Take 1 tablet (4 mg total) by mouth every 8 (eight) hours as  needed for nausea or vomiting. 20 tablet Wieters, [place] C, PA-C     Controlled Substance Prescriptions [place] Controlled Substance Registry consulted? Not Applicable   Lew Dawes, PA-C 06/23/18 1130    Wieters, [place] C, [place] 06/23/18 1209

## 2018-06-23 NOTE — ED Triage Notes (Signed)
Pt states she has abdominal pain that started last night.

## 2018-06-23 NOTE — Telephone Encounter (Signed)
Attempted to reach patient regarding results. No answer at this time. Per [facility] PA patient should follow up with general surgery and go to ER for worsening symptoms.

## 2018-06-23 NOTE — Discharge Instructions (Addendum)
Please go to the main entrance of [facility] and ask for radiology, they can work you in for an ultrasound  Please use Zofran as needed for nausea vomiting  We drew some blood, we will call you if these return abnormal  Please go to the emergency room if developing fever, developing yellow discoloration of skin or eyes, worsening pain, persistent nausea vomiting  Please follow-up with surgery for reevaluation/consultation about gallbladder removal

## 2018-06-24 ENCOUNTER — Encounter: Payer: Self-pay | Admitting: Women's Health

## 2018-06-24 DIAGNOSIS — Z0289 Encounter for other administrative examinations: Secondary | ICD-10-CM

## 2018-06-25 ENCOUNTER — Telehealth (HOSPITAL_COMMUNITY): Payer: Self-pay

## 2018-06-25 NOTE — Telephone Encounter (Signed)
No answer x2 

## 2018-06-29 ENCOUNTER — Telehealth (HOSPITAL_COMMUNITY): Payer: Self-pay

## 2018-06-29 NOTE — Telephone Encounter (Signed)
Pt aware of results and instructions from [facility] to follow up with general surgery.  Pt reports feeling better.

## 2018-08-06 ENCOUNTER — Encounter: Payer: Self-pay | Admitting: Women's Health

## 2018-09-02 ENCOUNTER — Encounter: Payer: Self-pay | Admitting: Women's Health

## 2018-10-09 ENCOUNTER — Encounter (HOSPITAL_COMMUNITY): Payer: Self-pay | Admitting: Emergency Medicine

## 2018-10-09 ENCOUNTER — Ambulatory Visit (HOSPITAL_COMMUNITY)
Admission: EM | Admit: 2018-10-09 | Discharge: 2018-10-09 | Disposition: A | Discharge status: Home / Self Care | Payer: PRIVATE HEALTH INSURANCE | Attending: Internal Medicine | Admitting: Internal Medicine

## 2018-10-09 DIAGNOSIS — J020 Streptococcal pharyngitis: Secondary | ICD-10-CM | POA: Diagnosis not present

## 2018-10-09 LAB — POCT RAPID STREP A: Streptococcus, Group A Screen (Direct): POSITIVE — AB

## 2018-10-09 MED ORDER — AMOXICILLIN-POT CLAVULANATE 875-125 MG PO TABS: 1.0000 | ORAL_TABLET | Freq: Two times a day (BID) | ORAL | 0 refills | Status: DC

## 2018-10-09 NOTE — ED Provider Notes (Signed)
[facility]    CSN: [id number] Arrival date & time: 10/09/18  1258     History   Chief Complaint Chief Complaint  Patient presents with  . Sore Throat    HPI Carmen Baker is a 47 y.o. female.   Subjective:   History was provided by the patient. Carmen Baker is a 47 y.o. female who presents for evaluation of a sore throat. Associated symptoms include suspected fevers but not measured at home and hot and cold spells, myalgias and sore throat. Onset of symptoms was 1 day ago and has been gradually worsening since that time. She is drinking plenty of fluids. She has not had recent close exposure to someone with proven streptococcal pharyngitis.  The following portions of the patient's history were reviewed and updated as appropriate: allergies, current medications, past family history, past medical history, past social history, past surgical history and problem list.       Past Medical History:  Diagnosis Date  . Hypertension     Patient Active Problem List   Diagnosis Date Noted  . Preeclampsia 07/08/2016  . Vaginal delivery 07/04/2016  . Gestational hypertension, antepartum 07/02/2016  . Gestational hypertension 07/02/2016  . AMA (advanced maternal age) multigravida 35+ 04/30/2016  . History of gestational hypertension--2nd pregnancy 04/30/2016  . Rh negative state in antepartum period 04/30/2016  . Reflux 04/30/2016    Past Surgical History:  Procedure Laterality Date  . NO PAST SURGERIES      OB History    Gravida  3   Para  3   Term  3   Preterm      AB      Living  3     SAB      TAB      Ectopic      Multiple  0   Live Births  3            Home Medications    Prior to Admission medications   Medication Sig Start Date End Date Taking? Authorizing Provider  amoxicillin-clavulanate (AUGMENTIN) 875-125 MG tablet Take 1 tablet by mouth every 12 (twelve) hours. 10/09/18   Lurline IdolMurrill, Katryn Plummer, FNP  ibuprofen  (ADVIL,MOTRIN) 200 MG tablet Take 400 mg by mouth every 6 (six) hours as needed for headache.    [provider]  ondansetron (ZOFRAN ODT) 4 MG disintegrating tablet Take 1 tablet (4 mg total) by mouth every 8 (eight) hours as needed for nausea or vomiting. 06/23/18   Wieters, Junius CreamerHallie C, PA-C    Family History Family History  Problem Relation Age of Onset  . Multiple sclerosis Mother   . COPD Father   . Cancer Maternal Grandmother   . Cancer Paternal Grandmother   . Cancer Paternal Grandfather     Social History Social History   Tobacco Use  . Smoking status: Never Smoker  . Smokeless tobacco: Never Used  Substance Use Topics  . Alcohol use: Yes  . Drug use: No     Allergies   Patient has no known allergies.   Review of Systems Review of Systems  Constitutional: Positive for chills and fever.  HENT: Positive for sore throat.   Eyes: Negative.   Respiratory: Negative.   Gastrointestinal: Negative.   Musculoskeletal: Positive for myalgias.  All other systems reviewed and are negative.    Physical Exam Triage Vital Signs ED Triage Vitals  Enc Vitals Group     BP 10/09/18 1338 140/86     Pulse Rate 10/09/18 1338 (!)  120     Resp 10/09/18 1338 18     Temp 10/09/18 1338 (!) 101.2 F (38.4 C)     Temp Source 10/09/18 1338 Oral     SpO2 10/09/18 1338 96 %     Weight --      Height --      Head Circumference --      Peak Flow --      Pain Score 10/09/18 1339 8     Pain Loc --      Pain Edu? --      Excl. in GC? --    No data found.  Updated Vital Signs BP 140/86 (BP Location: Right Arm)   Pulse (!) 120   Temp (!) 101.2 F (38.4 C) (Oral)   Resp 18   SpO2 96%   Visual Acuity Right Eye Distance:   Left Eye Distance:   Bilateral Distance:    Right Eye Near:   Left Eye Near:    Bilateral Near:     Physical Exam Constitutional:      General: She is not in acute distress.    Appearance: She is well-developed. She is not ill-appearing,  toxic-appearing or diaphoretic.  HENT:     Head: Normocephalic.     Right Ear: Tympanic membrane normal.     Left Ear: Tympanic membrane normal.     Nose: No congestion or rhinorrhea.     Mouth/Throat:     Mouth: Mucous membranes are moist.     Pharynx: Pharyngeal swelling and posterior oropharyngeal erythema present.  Eyes:     Conjunctiva/sclera: Conjunctivae normal.     Pupils: Pupils are equal, round, and reactive to light.  Neck:     Musculoskeletal: Normal range of motion and neck supple.  Cardiovascular:     Rate and Rhythm: Normal rate and regular rhythm.     Heart sounds: Normal heart sounds.  Pulmonary:     Effort: Pulmonary effort is normal.     Breath sounds: Normal breath sounds.  Lymphadenopathy:     Cervical: No cervical adenopathy.  Skin:    General: Skin is warm and dry.  Neurological:     General: No focal deficit present.     Mental Status: She is alert and oriented to person, place, and time.  Psychiatric:        Mood and Affect: Mood normal.        Behavior: Behavior normal.      UC Treatments / Results  Labs (all labs ordered are listed, but only abnormal results are displayed) Labs Reviewed  POCT RAPID STREP A - Abnormal; Notable for the following components:      Result Value   Streptococcus, Group A Screen (Direct) POSITIVE (*)    All other components within normal limits  CULTURE, GROUP A STREP [facility])    EKG None  Radiology No results found.  Procedures Procedures (including critical care time)  Medications Ordered in UC Medications - No data to display  Initial Impression / Assessment and Plan / UC Course  I have reviewed the triage vital signs and the nursing notes.  Pertinent labs & imaging results that were available during my care of the patient were reviewed by me and considered in my medical decision making (see chart for details).     47 yo female presenting with an acute onset of sore throat, subjective fevers,  hot/cold spells and myalgias.  Denies any close exposures with proven streptococcal pharyngitis.  Patient febrile and  tachycardic in the clinic. She took ibuprofen in the car prior to arrival. At discharge, patient's temp down to 98.2 and HR down to 104. Patient is nontoxic-appearing.  Rapid strep positive.   Plan:  Patient placed on antibiotics. Use of OTC analgesics recommended as well as salt water gargles. Use of decongestant recommended. Patient advised that he will be infectious for 24 hours after starting antibiotics. Follow up as needed.  Today's evaluation has revealed no signs of a dangerous process. Discussed diagnosis with patient. Patient aware of their diagnosis, possible red flag symptoms to watch out for and need for close follow up. Patient understands verbal and written discharge instructions. Patient comfortable with plan and disposition.  Patient has a clear mental status at this time, good insight into illness (after discussion and teaching) and has clear judgment to make decisions regarding their care.  Documentation was completed with the aid of voice recognition software. Transcription may contain typographical errors. Final Clinical Impressions(s) / UC Diagnoses   Final diagnoses:  Strep pharyngitis     Discharge Instructions     Take medications as prescribed. Warm salt water gargles to help with pain. I also recommended you take an over-the-counter decongestant like sudafed. You are considered infectious for 24 hours after starting antibiotics. Change your toothbrush on Sunday.Take tylenol/ibuprofen for fevers. Drink plenty of fluids.     ED Prescriptions    Medication Sig Dispense Auth. Provider   amoxicillin-clavulanate (AUGMENTIN) 875-125 MG tablet Take 1 tablet by mouth every 12 (twelve) hours. 14 tablet Lurline IdolMurrill, Avary Pitsenbarger, FNP     Controlled Substance Prescriptions [place] Controlled Substance Registry consulted? Not Applicable   Lurline IdolMurrill, Paublo Warshawsky,  FNP 10/09/18 1429

## 2018-10-09 NOTE — Discharge Instructions (Addendum)
Take medications as prescribed. Warm salt water gargles to help with pain. I also recommended you take an over-the-counter decongestant like sudafed. You are considered infectious for 24 hours after starting antibiotics. Change your toothbrush on Sunday.Take tylenol/ibuprofen for fevers. Drink plenty of fluids.

## 2018-10-09 NOTE — ED Triage Notes (Signed)
Pt sts sore throat and fever x 2 days  

## 2019-05-20 IMAGING — US US ABDOMEN LIMITED
1 series · 14 of 25 positions shown · non-contrast
Comparison: None.

CLINICAL DATA: 45-year-old female with intermittent right upper
quadrant pain x1 year.

EXAM:
ULTRASOUND ABDOMEN LIMITED RIGHT UPPER QUADRANT

[Series 1: us abdomen limited · 0.26mm/px · 14 of 47 slices shown]
[im 1/47]
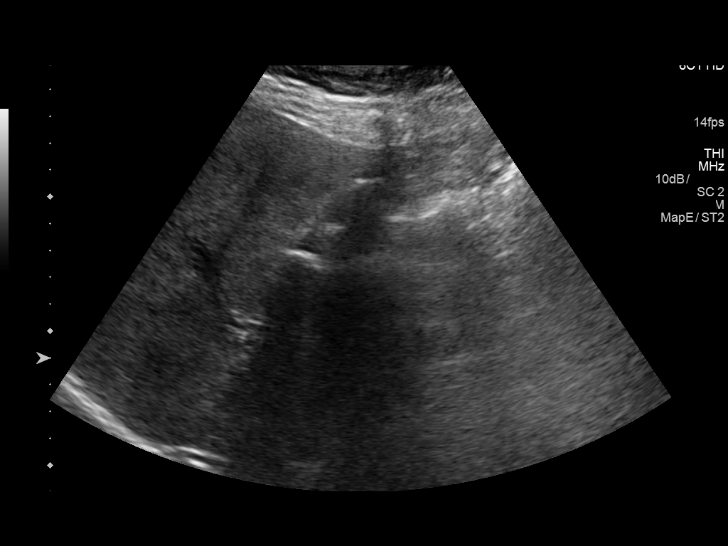
[im 4/47]
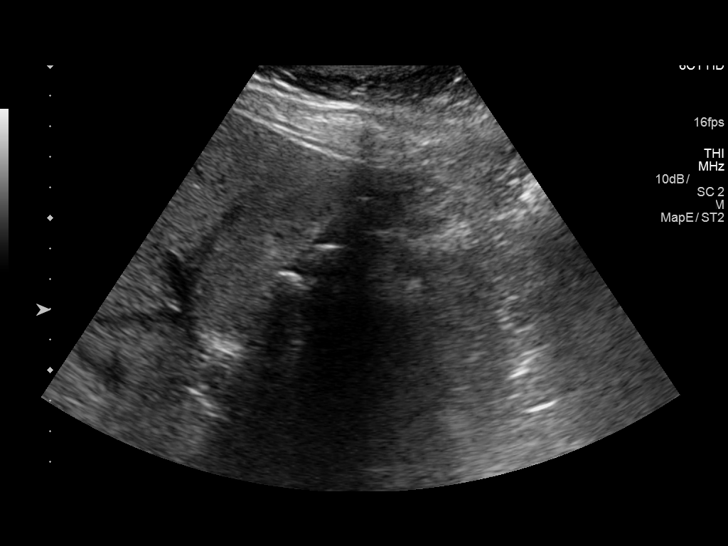
[im 8/47]
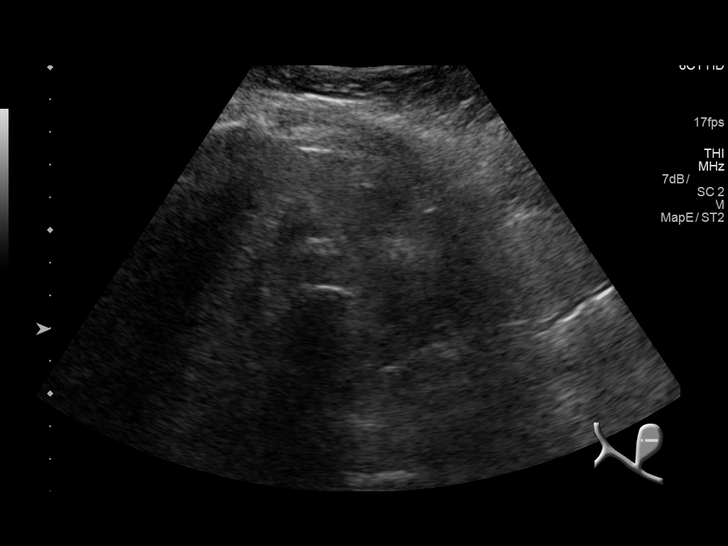
[im 12/47]
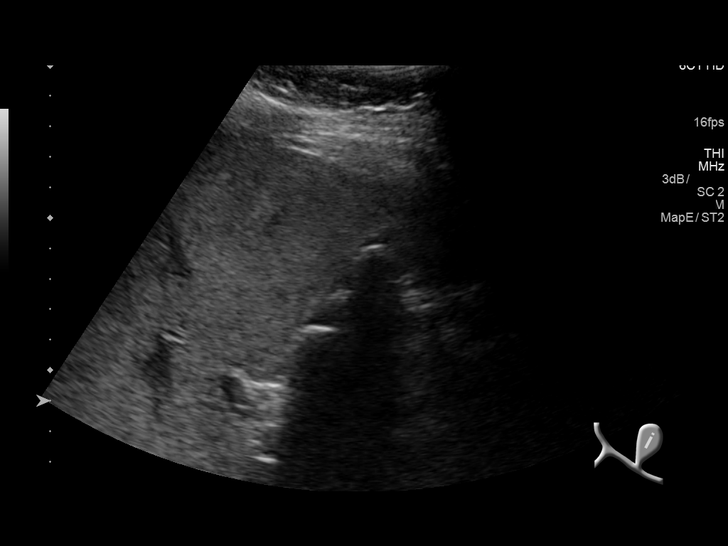
[im 16/47]
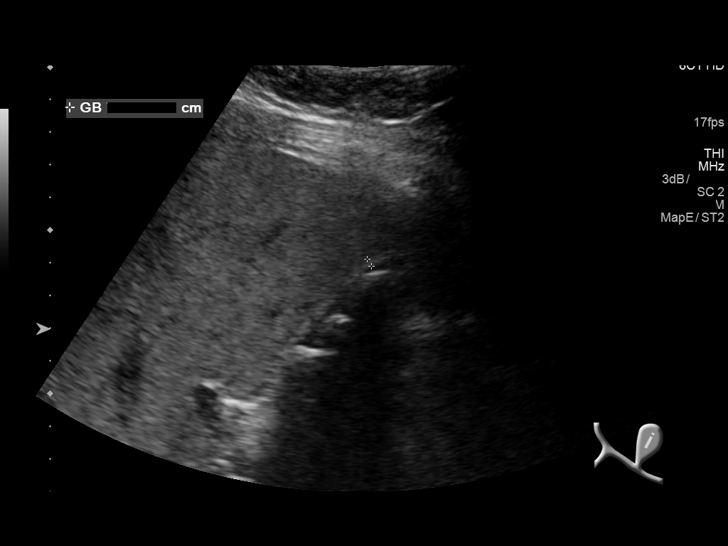
[im 18/47]
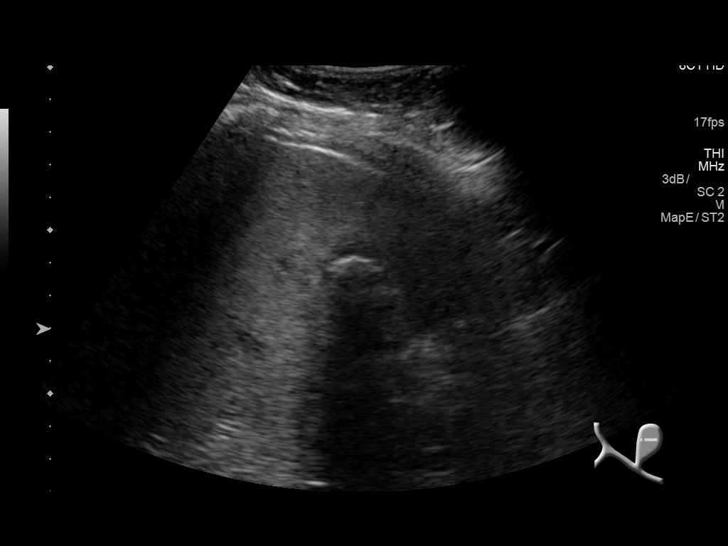
[im 22/47]
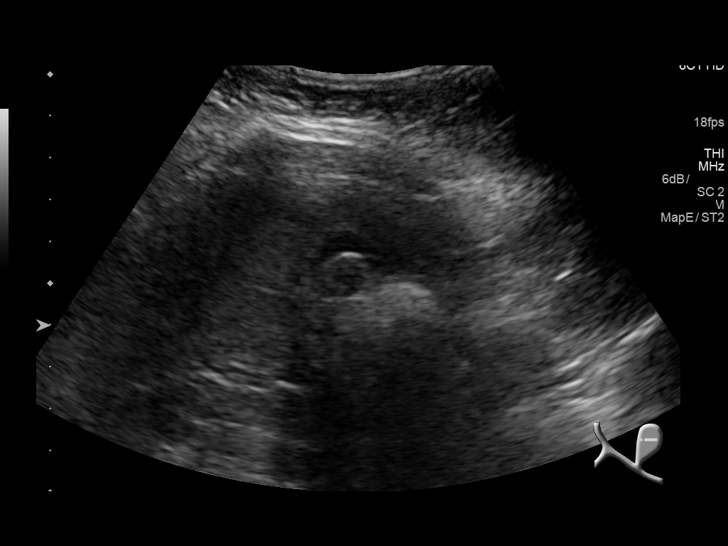
[im 25/47]
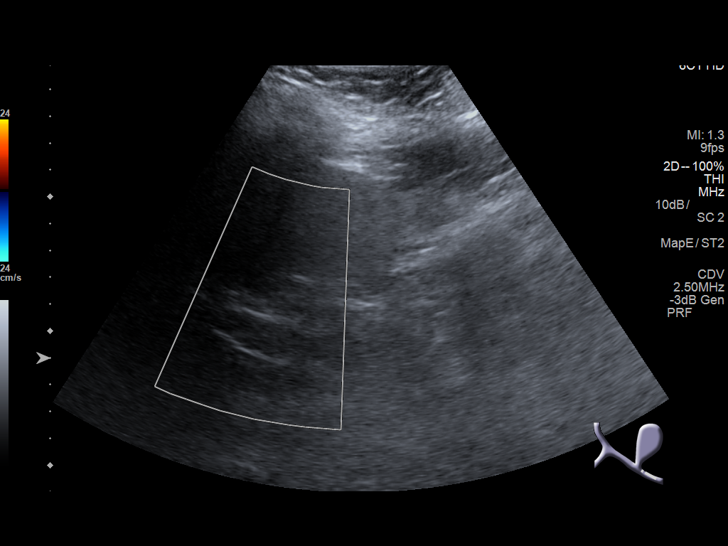
[im 29/47]
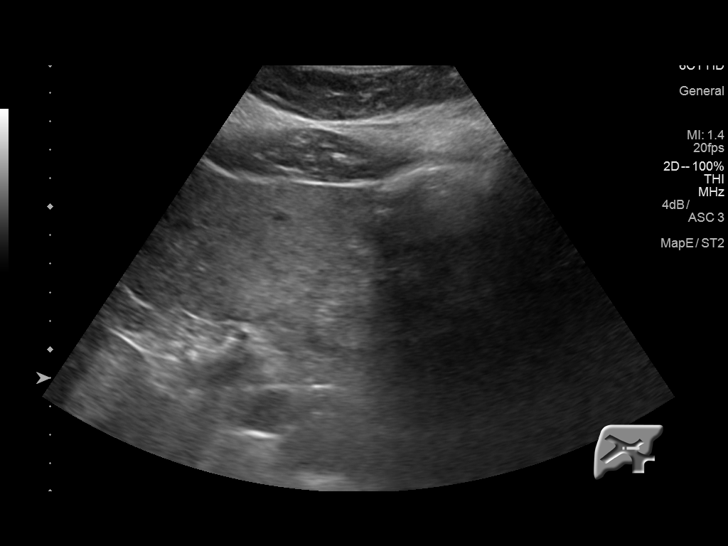
[im 31/47]
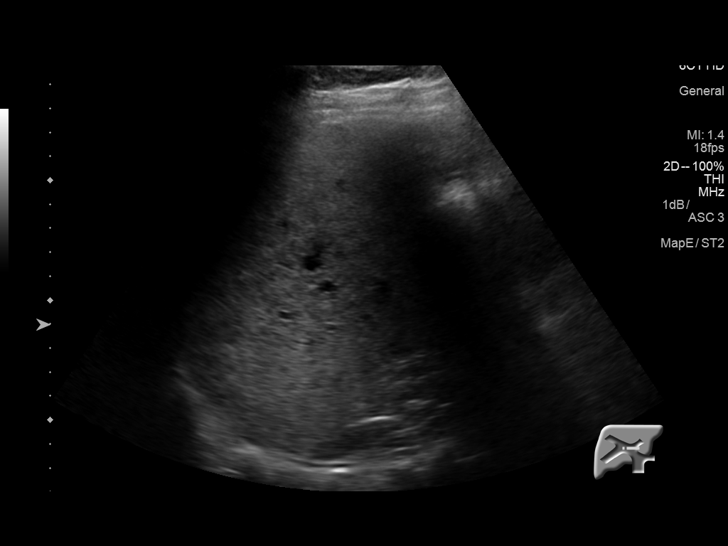
[im 35/47]
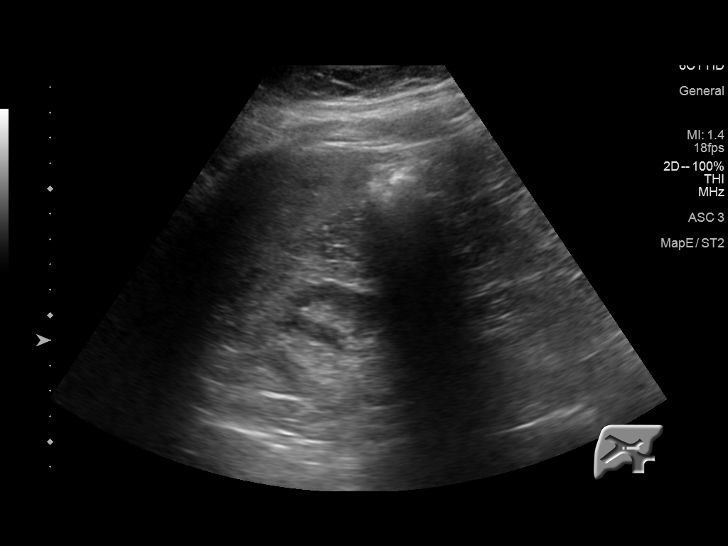
[im 39/47]
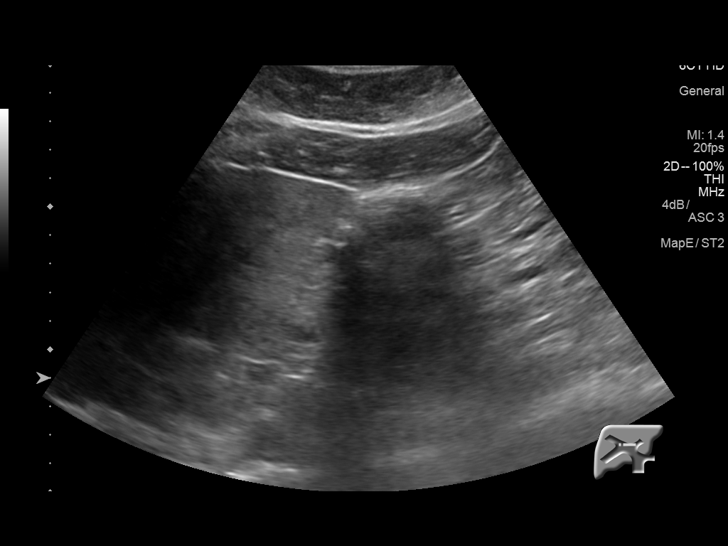
[im 43/47]
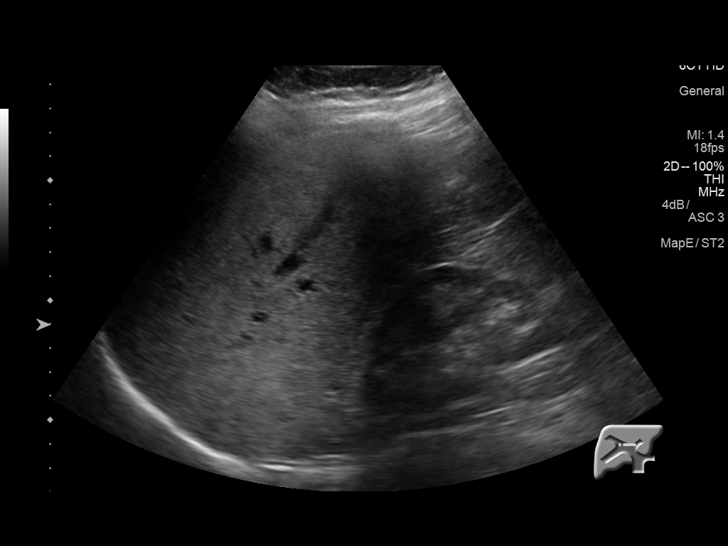
[im 47/47]
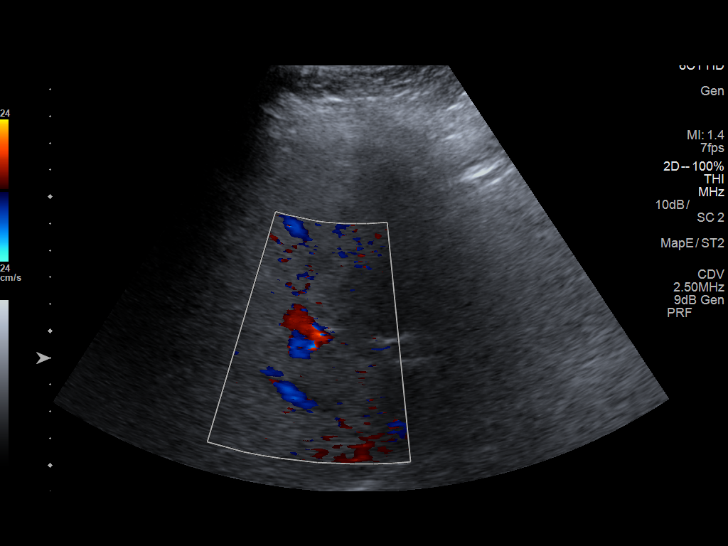

[14 of 25 positions shown; findings below may reference images not displayed]

FINDINGS: Evaluation is limited due to patient's body habitus.

Gallbladder:

There multiple stones within the gallbladder. Evaluation of the
gallbladder is limited due to shadowing caused by gallstones. There
is no gallbladder wall thickening, or pericholecystic fluid.
Negative sonographic Murphy's sign.

Common bile duct:

Diameter: 6 mm with tapering at the head of the pancreas.

Liver:

The liver demonstrates a heterogeneous and increased echogenicity
most likely related to fatty infiltration. Superimposed inflammation
or fibrosis is not excluded. The visualized portion of the main
portal vein appears patent with hepatopetal flow.
IMPRESSION: 1. Cholelithiasis without sonographic evidence of acute
cholecystitis. A hepatobiliary scintigraphy may provide better
evaluation of the gallbladder if there is a high clinical concern
for acute cholecystitis .
2. Fatty liver.

## 2019-08-12 ENCOUNTER — Encounter: Payer: Self-pay | Admitting: Gynecology

## 2019-08-17 ENCOUNTER — Encounter: Payer: Self-pay | Admitting: Gynecology

## 2019-08-18 ENCOUNTER — Encounter: Payer: Self-pay | Admitting: Gynecology

## 2019-08-20 ENCOUNTER — Other Ambulatory Visit: Payer: Self-pay

## 2019-09-21 ENCOUNTER — Other Ambulatory Visit: Payer: Self-pay

## 2019-09-21 ENCOUNTER — Ambulatory Visit (INDEPENDENT_AMBULATORY_CARE_PROVIDER_SITE_OTHER): Payer: PRIVATE HEALTH INSURANCE | Admitting: Women's Health

## 2019-09-21 ENCOUNTER — Encounter: Payer: Self-pay | Admitting: Women's Health

## 2019-09-21 VITALS — BP 132/80 | Ht 67.0 in | Wt 226.0 lb

## 2019-09-21 DIAGNOSIS — Z01419 Encounter for gynecological examination (general) (routine) without abnormal findings: Secondary | ICD-10-CM

## 2019-09-21 MED ORDER — NORETHINDRONE 0.35 MG PO TABS: 1.0000 | ORAL_TABLET | Freq: Every day | ORAL | 4 refills | Status: DC

## 2019-09-21 NOTE — Addendum Note (Signed)
Addended by: Lorine Bears on: 09/21/2019 04:11 PM   Modules accepted: Orders

## 2019-09-21 NOTE — Patient Instructions (Addendum)
Good to see you! Vit D 1000 iu daily Carbohydrate Counting for Diabetes Mellitus, Adult  Carbohydrate counting is a method of keeping track of how many carbohydrates you eat. Eating carbohydrates naturally increases the amount of sugar (glucose) in the blood. Counting how many carbohydrates you eat helps keep your blood glucose within normal limits, which helps you manage your diabetes (diabetes mellitus). It is important to know how many carbohydrates you can safely have in each meal. This is different for every person. A diet and nutrition specialist (registered dietitian) can help you make a meal plan and calculate how many carbohydrates you should have at each meal and snack. Carbohydrates are found in the following foods:  Grains, such as breads and cereals.  Dried beans and soy products.  Starchy vegetables, such as potatoes, peas, and corn.  Fruit and fruit juices.  Milk and yogurt.  Sweets and snack foods, such as cake, cookies, candy, chips, and soft drinks. How do I count carbohydrates? There are two ways to count carbohydrates in food. You can use either of the methods or a combination of both. Reading "Nutrition Facts" on packaged food The "Nutrition Facts" list is included on the labels of almost all packaged foods and beverages in the [place] It includes:  The serving size.  Information about nutrients in each serving, including the grams (g) of carbohydrate per serving. To use the "Nutrition Facts":  Decide how many servings you will have.  Multiply the number of servings by the number of carbohydrates per serving.  The resulting number is the total amount of carbohydrates that you will be having. Learning standard serving sizes of other foods When you eat carbohydrate foods that are not packaged or do not include "Nutrition Facts" on the label, you need to measure the servings in order to count the amount of carbohydrates:  Measure the foods that you will eat with a  food scale or measuring cup, if needed.  Decide how many standard-size servings you will eat.  Multiply the number of servings by 15. Most carbohydrate-rich foods have about 15 g of carbohydrates per serving. ? For example, if you eat 8 oz (170 g) of strawberries, you will have eaten 2 servings and 30 g of carbohydrates (2 servings x 15 g = 30 g).  For foods that have more than one food mixed, such as soups and casseroles, you must count the carbohydrates in each food that is included. The following list contains standard serving sizes of common carbohydrate-rich foods. Each of these servings has about 15 g of carbohydrates:   hamburger bun or  English muffin.   oz (15 mL) syrup.   oz (14 g) jelly.  1 slice of bread.  1 six-inch tortilla.  3 oz (85 g) cooked rice or pasta.  4 oz (113 g) cooked dried beans.  4 oz (113 g) starchy vegetable, such as peas, corn, or potatoes.  4 oz (113 g) hot cereal.  4 oz (113 g) mashed potatoes or  of a large baked potato.  4 oz (113 g) canned or frozen fruit.  4 oz (120 mL) fruit juice.  4-6 crackers.  6 chicken nuggets.  6 oz (170 g) unsweetened dry cereal.  6 oz (170 g) plain fat-free yogurt or yogurt sweetened with artificial sweeteners.  8 oz (240 mL) milk.  8 oz (170 g) fresh fruit or one small piece of fruit.  24 oz (680 g) popped popcorn. Example of carbohydrate counting Sample meal  3 oz (  85 g) chicken breast.  6 oz (170 g) brown rice.  4 oz (113 g) corn.  8 oz (240 mL) milk.  8 oz (170 g) strawberries with sugar-free whipped topping. Carbohydrate calculation 1. Identify the foods that contain carbohydrates: ? Rice. ? Corn. ? Milk. ? Strawberries. 2. Calculate how many servings you have of each food: ? 2 servings rice. ? 1 serving corn. ? 1 serving milk. ? 1 serving strawberries. 3. Multiply each number of servings by 15 g: ? 2 servings rice x 15 g = 30 g. ? 1 serving corn x 15 g = 15 g. ? 1 serving  milk x 15 g = 15 g. ? 1 serving strawberries x 15 g = 15 g. 4. Add together all of the amounts to find the total grams of carbohydrates eaten: ? 30 g + 15 g + 15 g + 15 g = 75 g of carbohydrates total. Summary  Carbohydrate counting is a method of keeping track of how many carbohydrates you eat.  Eating carbohydrates naturally increases the amount of sugar (glucose) in the blood.  Counting how many carbohydrates you eat helps keep your blood glucose within normal limits, which helps you manage your diabetes.  A diet and nutrition specialist (registered dietitian) can help you make a meal plan and calculate how many carbohydrates you should have at each meal and snack. This information is not intended to replace advice given to you by your health care provider. Make sure you discuss any questions you have with your health care provider. Document Released: 10/07/2005 Document Revised: 05/01/2017 Document Reviewed: 03/20/2016 Elsevier Patient Education  2020 [employer].   Health Maintenance, Female Adopting a healthy lifestyle and getting preventive care are important in promoting health and wellness. Ask your health care provider about:  The right schedule for you to have regular tests and exams.  Things you can do on your own to prevent diseases and keep yourself healthy. What should I know about diet, weight, and exercise? Eat a healthy diet   Eat a diet that includes plenty of vegetables, fruits, low-fat dairy products, and lean protein.  Do not eat a lot of foods that are high in solid fats, added sugars, or sodium. Maintain a healthy weight Body mass index (BMI) is used to identify weight problems. It estimates body fat based on height and weight. Your health care provider can help determine your BMI and help you achieve or maintain a healthy weight. Get regular exercise Get regular exercise. This is one of the most important things you can do for your health. Most adults  should:  Exercise for at least 150 minutes each week. The exercise should increase your heart rate and make you sweat (moderate-intensity exercise).  Do strengthening exercises at least twice a week. This is in addition to the moderate-intensity exercise.  Spend less time sitting. Even light physical activity can be beneficial. Watch cholesterol and blood lipids Have your blood tested for lipids and cholesterol at 47 years of age, then have this test every 5 years. Have your cholesterol levels checked more often if:  Your lipid or cholesterol levels are high.  You are older than 47 years of age.  You are at high risk for heart disease. What should I know about cancer screening? Depending on your health history and family history, you may need to have cancer screening at various ages. This may include screening for:  Breast cancer.  Cervical cancer.  Colorectal cancer.  Skin cancer.  Lung cancer. What should I know about heart disease, diabetes, and high blood pressure? Blood pressure and heart disease  High blood pressure causes heart disease and increases the risk of stroke. This is more likely to develop in people who have high blood pressure readings, are of African descent, or are overweight.  Have your blood pressure checked: ? Every 3-5 years if you are 6618-47 years of age. ? Every year if you are 47 years old or older. Diabetes Have regular diabetes screenings. This checks your fasting blood sugar level. Have the screening done:  Once every three years after age 47 if you are at a normal weight and have a low risk for diabetes.  More often and at a younger age if you are overweight or have a high risk for diabetes. What should I know about preventing infection? Hepatitis B If you have a higher risk for hepatitis B, you should be screened for this virus. Talk with your health care provider to find out if you are at risk for hepatitis B infection. Hepatitis C Testing  is recommended for:  Everyone born from [birth year] through 1965.  Anyone with known risk factors for hepatitis C. Sexually transmitted infections (STIs)  Get screened for STIs, including gonorrhea and chlamydia, if: ? You are sexually active and are younger than 47 years of age. ? You are older than 47 years of age and your health care provider tells you that you are at risk for this type of infection. ? Your sexual activity has changed since you were last screened, and you are at increased risk for chlamydia or gonorrhea. Ask your health care provider if you are at risk.  Ask your health care provider about whether you are at high risk for HIV. Your health care provider may recommend a prescription medicine to help prevent HIV infection. If you choose to take medicine to prevent HIV, you should first get tested for HIV. You should then be tested every 3 months for as long as you are taking the medicine. Pregnancy  If you are about to stop having your period (premenopausal) and you may become pregnant, seek counseling before you get pregnant.  Take 400 to 800 micrograms (mcg) of folic acid every day if you become pregnant.  Ask for birth control (contraception) if you want to prevent pregnancy. Osteoporosis and menopause Osteoporosis is a disease in which the bones lose minerals and strength with aging. This can result in bone fractures. If you are 47 years old or older, or if you are at risk for osteoporosis and fractures, ask your health care provider if you should:  Be screened for bone loss.  Take a calcium or vitamin D supplement to lower your risk of fractures.  Be given hormone replacement therapy (HRT) to treat symptoms of menopause. Follow these instructions at home: Lifestyle  Do not use any products that contain nicotine or tobacco, such as cigarettes, e-cigarettes, and chewing tobacco. If you need help quitting, ask your health care provider.  Do not use street drugs.  Do not  share needles.  Ask your health care provider for help if you need support or information about quitting drugs. Alcohol use  Do not drink alcohol if: ? Your health care provider tells you not to drink. ? You are pregnant, may be pregnant, or are planning to become pregnant.  If you drink alcohol: ? Limit how much you use to 0-1 drink a day. ? Limit intake if you are breastfeeding.  Be aware of how much alcohol is in your drink. In the [place] one drink equals one 12 oz bottle of beer (355 mL), one 5 oz glass of wine (148 mL), or one 1 oz glass of hard liquor (44 mL). General instructions  Schedule regular health, dental, and eye exams.  Stay current with your vaccines.  Tell your health care provider if: ? You often feel depressed. ? You have ever been abused or do not feel safe at home. Summary  Adopting a healthy lifestyle and getting preventive care are important in promoting health and wellness.  Follow your health care provider's instructions about healthy diet, exercising, and getting tested or screened for diseases.  Follow your health care provider's instructions on monitoring your cholesterol and blood pressure. This information is not intended to replace advice given to you by your health care provider. Make sure you discuss any questions you have with your health care provider. Document Released: 04/22/2011 Document Revised: 09/30/2018 Document Reviewed: 09/30/2018 Elsevier Patient Education  2020 [employer].

## 2019-09-21 NOTE — Progress Notes (Signed)
Carmen Baker August 24, 1971 [id number]    History:    Presents for annual exam.  Monthly cycle/condoms.  Normal Pap history.  Mammogram 07/2019 questionable cyst as follow-up ultrasound in 6 months to check stability.  History of a right asymptomatic Bartholin cyst.  History of GDM and PIH.  Past medical history, past surgical history, family history and social history were all reviewed and documented in the EPIC chart.  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  Son 25, lives in [place], daughters 1 and 3 all doing well and has 4 stepchildren that lives in [place].  Originally from [place].  ROS:  A ROS was performed and pertinent positives and negatives are included.  Exam:  Vitals:   09/21/19 1450  BP: 132/80  Weight: 226 lb (102.5 kg)  Height: 5\' 7"  (1.702 m)   Body mass index is 35.4 kg/m.   General appearance:  Normal Thyroid:  Symmetrical, normal in size, without palpable masses or nodularity. Respiratory  Auscultation:  Clear without wheezing or rhonchi Cardiovascular  Auscultation:  Regular rate, without rubs, murmurs or gallops  Edema/varicosities:  Not grossly evident Abdominal  Soft,nontender, without masses, guarding or rebound.  Liver/spleen:  No organomegaly noted  Hernia:  None appreciated  Skin  Inspection:  Grossly normal   Breasts: Examined lying and sitting.     Right: Without masses, retractions, discharge or axillary adenopathy.     Left: Without masses, retractions, discharge or axillary adenopathy. Gentitourinary   Inguinal/mons:  Normal without inguinal adenopathy  External genitalia:  Normal  BUS/Urethra/Skene's glands: 4 cm nontender nonerythemic right Bartholin cyst /years  Vagina:  Normal  Cervix:  Normal  Uterus:   normal in size, shape and contour.  Midline and mobile  Adnexa/parametria:     Rt: Without masses or tenderness.   Lt: Without masses or tenderness.  Anus and perineum: Normal  Digital rectal exam: Normal sphincter tone without palpated  masses or tenderness  Assessment/Plan:  48 y.o. MWF G3, P3 for annual exam with no complaints.  Regular monthly cycles/condoms Contraception management Obesity 2019 gallbladder disease-was recommended follow-up with surgeon-declined  Plan:.  Contraception options reviewed, Micronor prescription, proper use, slight risk for blood clots and strokes, reviewed better profile with history of elevated blood pressure.  Blood pressure high end of normal today but has had problems with elevated pressure in the past. Condoms first month.  Start up instructions discussed.  SBEs, continue annual screening mammogram and keep scheduled follow-up 65-month for stability of cyst.  Reviewed importance of increasing exercise and decreasing calories/carbs.  Gallbladder disease/surgery discussed encouraged follow-up.  CBC, CMP, Pap with HR HPV typing, new screening guidelines reviewed.    Huel Cote [facility], 3:22 PM 09/21/2019

## 2019-09-22 ENCOUNTER — Other Ambulatory Visit: Payer: Self-pay

## 2019-09-22 LAB — CBC WITH DIFFERENTIAL/PLATELET
Absolute Monocytes: 800 cells/uL (ref 200–950)
Basophils Absolute: 75 cells/uL (ref 0–200)
Basophils Relative: 0.6 %
Eosinophils Absolute: 375 cells/uL (ref 15–500)
Eosinophils Relative: 3 %
HCT: 39 % (ref 35.0–45.0)
Hemoglobin: 13.1 g/dL (ref 11.7–15.5)
Lymphs Abs: 3863 cells/uL (ref 850–3900)
MCH: 30.4 pg (ref 27.0–33.0)
MCHC: 33.6 g/dL (ref 32.0–36.0)
MCV: 90.5 fL (ref 80.0–100.0)
MPV: 11.5 fL (ref 7.5–12.5)
Monocytes Relative: 6.4 %
Neutro Abs: 7388 cells/uL (ref 1500–7800)
Neutrophils Relative %: 59.1 %
Platelets: 159 10*3/uL (ref 140–400)
RBC: 4.31 10*6/uL (ref 3.80–5.10)
RDW: 12.6 % (ref 11.0–15.0)
Total Lymphocyte: 30.9 %
WBC: 12.5 10*3/uL — ABNORMAL HIGH (ref 3.8–10.8)

## 2019-09-22 LAB — COMPREHENSIVE METABOLIC PANEL
AG Ratio: 1.3 (calc) (ref 1.0–2.5)
ALT: 10 U/L (ref 6–29)
AST: 9 U/L — ABNORMAL LOW (ref 10–35)
Albumin: 4 g/dL (ref 3.6–5.1)
Alkaline phosphatase (APISO): 56 U/L (ref 31–125)
BUN: 11 mg/dL (ref 7–25)
CO2: 23 mmol/L (ref 20–32)
Calcium: 8.7 mg/dL (ref 8.6–10.2)
Chloride: 103 mmol/L (ref 98–110)
Creat: 0.84 mg/dL (ref 0.50–1.10)
Globulin: 3.1 g/dL (calc) (ref 1.9–3.7)
Glucose, Bld: 93 mg/dL (ref 65–99)
Potassium: 4.1 mmol/L (ref 3.5–5.3)
Sodium: 137 mmol/L (ref 135–146)
Total Bilirubin: 0.2 mg/dL (ref 0.2–1.2)
Total Protein: 7.1 g/dL (ref 6.1–8.1)

## 2019-09-22 LAB — NO CULTURE INDICATED

## 2019-09-22 LAB — URINALYSIS, COMPLETE W/RFL CULTURE
Bacteria, UA: NONE SEEN /HPF
Bilirubin Urine: NEGATIVE
Glucose, UA: NEGATIVE
Hgb urine dipstick: NEGATIVE
Hyaline Cast: NONE SEEN /LPF
Ketones, ur: NEGATIVE
Leukocyte Esterase: NEGATIVE
Nitrites, Initial: NEGATIVE
Protein, ur: NEGATIVE
RBC / HPF: NONE SEEN /HPF (ref 0–2)
Specific Gravity, Urine: 1.024 (ref 1.001–1.03)
Squamous Epithelial / HPF: NONE SEEN /HPF (ref <=5)
WBC, UA: NONE SEEN /HPF (ref 0–5)
pH: 5.5 (ref 5.0–8.0)

## 2019-09-22 LAB — PAP, TP IMAGING W/ HPV RNA, RFLX HPV TYPE 16,18/45: HPV DNA High Risk: NOT DETECTED

## 2019-09-22 MED ORDER — NORETHINDRONE 0.35 MG PO TABS: 1.0000 | ORAL_TABLET | Freq: Every day | ORAL | 4 refills | Status: AC

## 2019-10-10 ENCOUNTER — Encounter (HOSPITAL_COMMUNITY): Payer: Self-pay

## 2019-10-10 ENCOUNTER — Emergency Department (HOSPITAL_COMMUNITY)
Admission: EM | Admit: 2019-10-10 | Discharge: 2019-10-10 | Disposition: A | Discharge status: Home / Self Care | Payer: PRIVATE HEALTH INSURANCE | Attending: Emergency Medicine | Admitting: Emergency Medicine

## 2019-10-10 ENCOUNTER — Emergency Department (HOSPITAL_COMMUNITY): Payer: PRIVATE HEALTH INSURANCE

## 2019-10-10 ENCOUNTER — Other Ambulatory Visit: Payer: Self-pay

## 2019-10-10 DIAGNOSIS — K8020 Calculus of gallbladder without cholecystitis without obstruction: Secondary | ICD-10-CM

## 2019-10-10 DIAGNOSIS — Z793 Long term (current) use of hormonal contraceptives: Secondary | ICD-10-CM | POA: Insufficient documentation

## 2019-10-10 DIAGNOSIS — R10811 Right upper quadrant abdominal tenderness: Secondary | ICD-10-CM | POA: Insufficient documentation

## 2019-10-10 DIAGNOSIS — R1013 Epigastric pain: Secondary | ICD-10-CM | POA: Diagnosis present

## 2019-10-10 LAB — URINALYSIS, ROUTINE W REFLEX MICROSCOPIC
Bacteria, UA: NONE SEEN
Bilirubin Urine: NEGATIVE
Glucose, UA: NEGATIVE mg/dL
Hgb urine dipstick: NEGATIVE
Ketones, ur: NEGATIVE mg/dL
Nitrite: NEGATIVE
Protein, ur: NEGATIVE mg/dL
Specific Gravity, Urine: 1.011 (ref 1.005–1.030)
pH: 6 (ref 5.0–8.0)

## 2019-10-10 LAB — COMPREHENSIVE METABOLIC PANEL
ALT: 15 U/L (ref 0–44)
AST: 12 U/L — ABNORMAL LOW (ref 15–41)
Albumin: 3.7 g/dL (ref 3.5–5.0)
Alkaline Phosphatase: 53 U/L (ref 38–126)
Anion gap: 8 (ref 5–15)
BUN: 6 mg/dL (ref 6–20)
CO2: 23 mmol/L (ref 22–32)
Calcium: 8.7 mg/dL — ABNORMAL LOW (ref 8.9–10.3)
Chloride: 106 mmol/L (ref 98–111)
Creatinine, Ser: 0.84 mg/dL (ref 0.44–1.00)
GFR calc Af Amer: >60 mL/min (ref >60)
GFR calc non Af Amer: >60 mL/min (ref >60)
Glucose, Bld: 99 mg/dL (ref 70–99)
Potassium: 4.2 mmol/L (ref 3.5–5.1)
Sodium: 137 mmol/L (ref 135–145)
Total Bilirubin: 0.4 mg/dL (ref 0.3–1.2)
Total Protein: 7.3 g/dL (ref 6.5–8.1)

## 2019-10-10 LAB — CBC
HCT: 41 % (ref 36.0–46.0)
Hemoglobin: 13.6 g/dL (ref 12.0–15.0)
MCH: 30.5 pg (ref 26.0–34.0)
MCHC: 33.2 g/dL (ref 30.0–36.0)
MCV: 91.9 fL (ref 80.0–100.0)
Platelets: 243 10*3/uL (ref 150–400)
RBC: 4.46 MIL/uL (ref 3.87–5.11)
RDW: 12.3 % (ref 11.5–15.5)
WBC: 12.2 10*3/uL — ABNORMAL HIGH (ref 4.0–10.5)
nRBC: 0 % (ref 0.0–0.2)

## 2019-10-10 LAB — I-STAT BETA HCG BLOOD, ED (MC, WL, AP ONLY): I-stat hCG, quantitative: <5 m[IU]/mL (ref <5)

## 2019-10-10 LAB — LIPASE, BLOOD: Lipase: 33 U/L (ref 11–51)

## 2019-10-10 MED ORDER — SODIUM CHLORIDE 0.9% FLUSH: 3.0000 mL | Freq: Once | INTRAVENOUS | Status: DC

## 2019-10-10 MED ORDER — ONDANSETRON HCL 4 MG PO TABS: 4.0000 mg | ORAL_TABLET | Freq: Four times a day (QID) | ORAL | 0 refills | Status: DC | PRN

## 2019-10-10 MED ORDER — ONDANSETRON HCL 4 MG PO TABS: 4.0000 mg | ORAL_TABLET | Freq: Four times a day (QID) | ORAL | 0 refills | Status: AC | PRN

## 2019-10-10 MED ORDER — HYDROCODONE-ACETAMINOPHEN 5-325 MG PO TABS: 1.0000 | ORAL_TABLET | Freq: Four times a day (QID) | ORAL | 0 refills | Status: AC | PRN

## 2019-10-10 NOTE — ED Triage Notes (Signed)
Pt presents w/upper abd pain x4 days, hx of gall bladder "issues" states it feels the same. Associated w/waves of nausea, vomit x1 on Thursday.

## 2019-10-10 NOTE — ED Notes (Signed)
Patient verbalizes understanding of discharge instructions. Opportunity for questioning and answers were provided. Armband removed by staff, pt discharged from ED.  

## 2019-10-17 NOTE — ED Provider Notes (Signed)
[facility] EMERGENCY DEPARTMENT Provider Note   CSN: [id number] Arrival date & time: 10/10/19  1509     History Chief Complaint  Patient presents with  . Abdominal Pain    Carmen Baker is a 48 y.o. female.  HPI   48 year old female with abdominal pain. Intermittent for the past 4 days. Upper abdomen. Associate with nausea. Vomited once Thursday. None since then. She reports a history of biliary colic. Is never had surgical evaluation. No fevers or chills. No urinary complaints. No diarrhea.  Past Medical History:  Diagnosis Date  . Hypertension     Patient Active Problem List   Diagnosis Date Noted  . Preeclampsia 07/08/2016  . Vaginal delivery 07/04/2016  . Gestational hypertension, antepartum 07/02/2016  . Gestational hypertension 07/02/2016  . AMA (advanced maternal age) multigravida 35+ 04/30/2016  . History of gestational hypertension--2nd pregnancy 04/30/2016  . Rh negative state in antepartum period 04/30/2016  . Reflux 04/30/2016    Past Surgical History:  Procedure Laterality Date  . NO PAST SURGERIES       OB History    Gravida  3   Para  3   Term  3   Preterm      AB      Living  3     SAB      TAB      Ectopic      Multiple  0   Live Births  3           Family History  Problem Relation Age of Onset  . Multiple sclerosis Mother   . COPD Father   . Cancer Maternal Grandmother   . Cancer Paternal Grandmother   . Cancer Paternal Grandfather     Social History   Tobacco Use  . Smoking status: Never Smoker  . Smokeless tobacco: Never Used  Substance Use Topics  . Alcohol use: Yes  . Drug use: No    Home Medications Prior to Admission medications   Medication Sig Start Date End Date Taking? Authorizing Provider  HYDROcodone-acetaminophen (NORCO/VICODIN) 5-325 MG tablet Take 1-2 tablets by mouth every 6 (six) hours as needed. 10/10/19   Raeford Razor, MD  ibuprofen (ADVIL,MOTRIN) 200 MG tablet  Take 400 mg by mouth every 6 (six) hours as needed for headache.    [provider]  norethindrone (ORTHO MICRONOR) 0.35 MG tablet Take 1 tablet (0.35 mg total) by mouth daily. 09/22/19   Harrington Challenger, NP  ondansetron (ZOFRAN) 4 MG tablet Take 1 tablet (4 mg total) by mouth every 6 (six) hours as needed for nausea or vomiting. 10/10/19   Raeford Razor, MD    Allergies    Patient has no known allergies.  Review of Systems   Review of Systems All systems reviewed and negative, other than as noted in HPI.  Physical Exam Updated Vital Signs BP (!) 153/85   Pulse 84   Temp 98.6 F (37 C) (Oral)   Resp 18   SpO2 99%   Physical Exam Vitals and nursing note reviewed.  Constitutional:      General: She is not in acute distress.    Appearance: She is well-developed.  HENT:     Head: Normocephalic and atraumatic.  Eyes:     General:        Right eye: No discharge.        Left eye: No discharge.     Conjunctiva/sclera: Conjunctivae normal.  Cardiovascular:     Rate and  Rhythm: Normal rate and regular rhythm.     Heart sounds: Normal heart sounds. No murmur. No friction rub. No gallop.   Pulmonary:     Effort: Pulmonary effort is normal. No respiratory distress.     Breath sounds: Normal breath sounds.  Abdominal:     General: There is no distension.     Palpations: Abdomen is soft.     Tenderness: There is no abdominal tenderness.     Comments: Epigastric right upper quadrant tenderness without rebound or guarding. No distention. No CVA tenderness.  Musculoskeletal:        General: No tenderness.     Cervical back: Neck supple.  Skin:    General: Skin is warm and dry.  Neurological:     Mental Status: She is alert.  Psychiatric:        Behavior: Behavior normal.        Thought Content: Thought content normal.     ED Results / Procedures / Treatments   Labs (all labs ordered are listed, but only abnormal results are displayed) Labs Reviewed  COMPREHENSIVE  METABOLIC PANEL - Abnormal; Notable for the following components:      Result Value   Calcium 8.7 (*)    AST 12 (*)    All other components within normal limits  CBC - Abnormal; Notable for the following components:   WBC 12.2 (*)    All other components within normal limits  URINALYSIS, ROUTINE W REFLEX MICROSCOPIC - Abnormal; Notable for the following components:   Leukocytes,Ua SMALL (*)    All other components within normal limits  LIPASE, BLOOD  I-STAT BETA HCG BLOOD, ED ([facility], [facility], [facility] ONLY)    EKG EKG Interpretation  Date/Time:  Sunday October 10 2019 15:50:41 EST Ventricular Rate:  102 PR Interval:  124 QRS Duration: 80 QT Interval:  328 QTC Calculation: 427 R Axis:   20 Text Interpretation: Sinus tachycardia Minimal voltage criteria for LVH, may be normal variant ( R in aVL ) Nonspecific ST and T wave abnormality Abnormal ECG Confirmed by Suhaila Troiano ([id number]) on 10/10/2019 6:21:12 PM   Radiology No results found.   US Abdomen Limited  Result Date: 10/10/2019 CLINICAL DATA:  Right upper quadrant pain for 4 days. Cholelithiasis. EXAM: ULTRASOUND ABDOMEN LIMITED RIGHT UPPER QUADRANT COMPARISON:  06/23/2018 FINDINGS: Gallbladder: Several gallstones are again seen, largest measuring 1.9 cm. No evidence of gallbladder wall thickening or pericholecystic fluid. No sonographic Murphy sign noted by sonographer. Common bile duct: Diameter: 2 mm, within normal limits. Liver: Diffusely increased echogenicity of the hepatic parenchyma, consistent with hepatic steatosis. No hepatic mass identified. Portal vein is patent on color Doppler imaging with normal direction of blood flow towards the liver. Other: None. IMPRESSION: Cholelithiasis. No sonographic signs of acute cholecystitis or biliary ductal dilatation. Diffuse hepatic steatosis. Electronically Signed   By: John A Stahl M.D.   On: 10/10/2019 18:35    Procedures Procedures (including critical care time)  Medications Ordered in  ED Medications - No data to display  ED Course  I have reviewed the triage vital signs and the nursing notes.  Pertinent labs & imaging results that were available during my care of the patient were reviewed by me and considered in my medical decision making (see chart for details).    MDM Rules/Calculators/A&P                      48  year old female with abdominal pain. Likely symptomatic cholelithiasis. Plan continue symptomatic  treatment. Outpatient surgical follow-up. Return precautions discussed. Final Clinical Impression(s) / ED Diagnoses Final diagnoses:  Symptomatic cholelithiasis    Rx / DC Orders ED Discharge Orders         Ordered    HYDROcodone-acetaminophen (NORCO/VICODIN) 5-325 MG tablet  Every 6 hours PRN     10/10/19 1907    ondansetron (ZOFRAN) 4 MG tablet  Every 6 hours PRN,   Status:  Discontinued     10/10/19 1907    ondansetron (ZOFRAN) 4 MG tablet  Every 6 hours PRN     10/10/19 1912           Raeford RazorKohut, Lasharon Dunivan, MD 10/17/19 1019

## 2020-03-26 IMAGING — US US ABDOMEN LIMITED
1 series · 14 of 25 positions shown · non-contrast
Comparison: 08/16/2017.

CLINICAL DATA: Acute right upper quadrant pain.

EXAM:
ULTRASOUND ABDOMEN LIMITED RIGHT UPPER QUADRANT

[Series 1: us abdomen limited · 0.22mm/px · 14 of 76 slices shown]
[im 1/76]
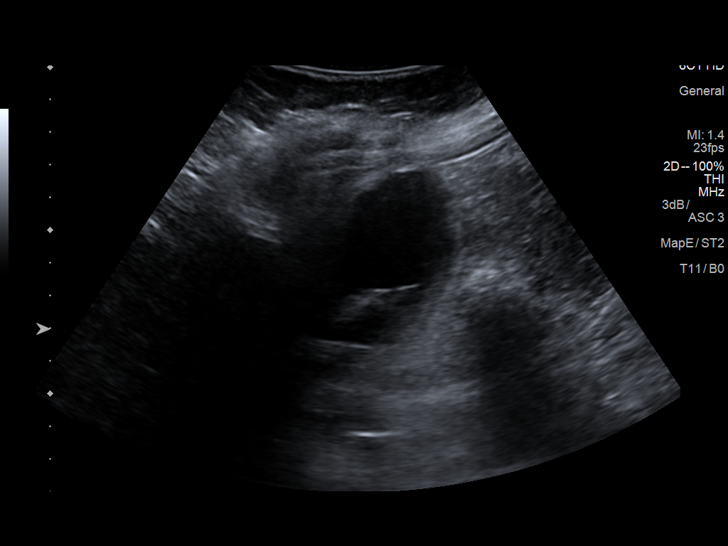
[im 7/76]
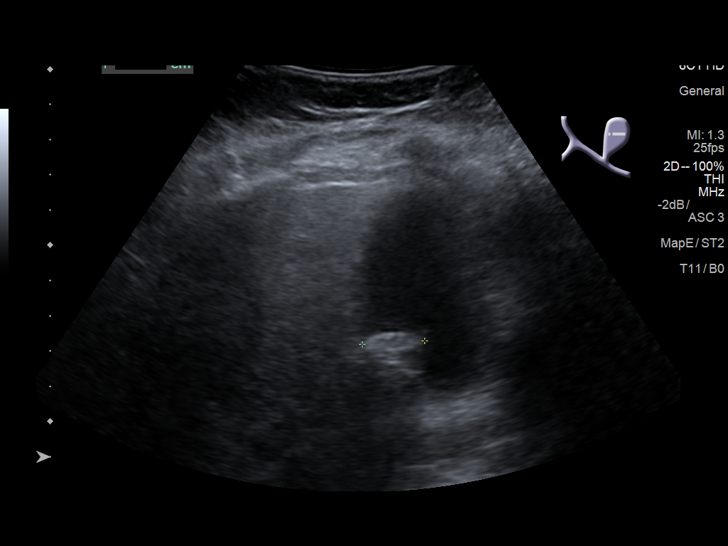
[im 13/76]
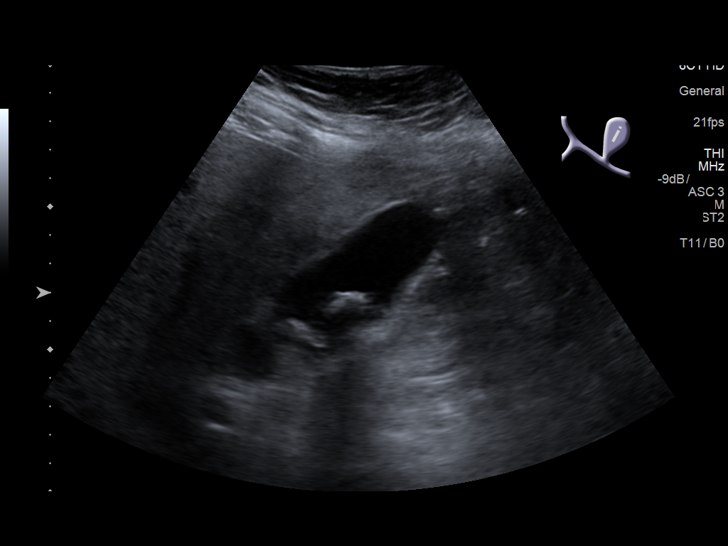
[im 19/76]
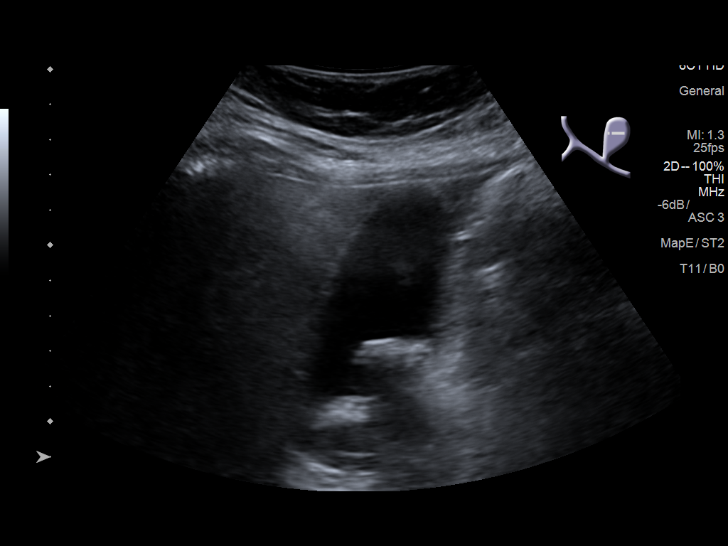
[im 26/76]
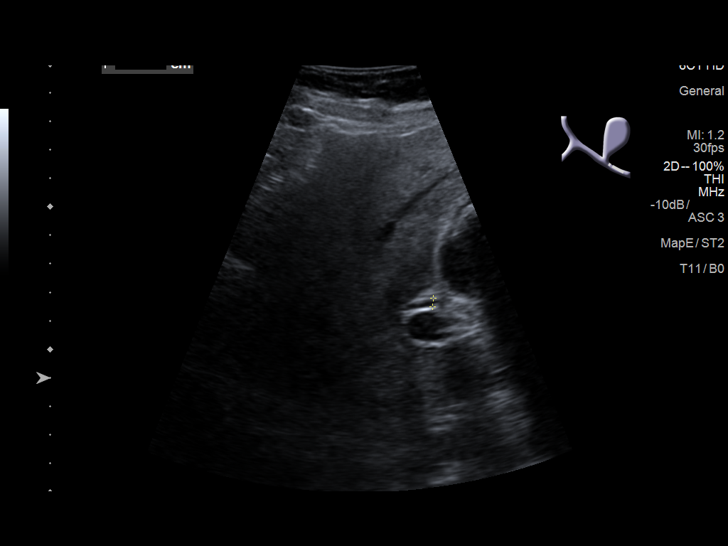
[im 29/76]
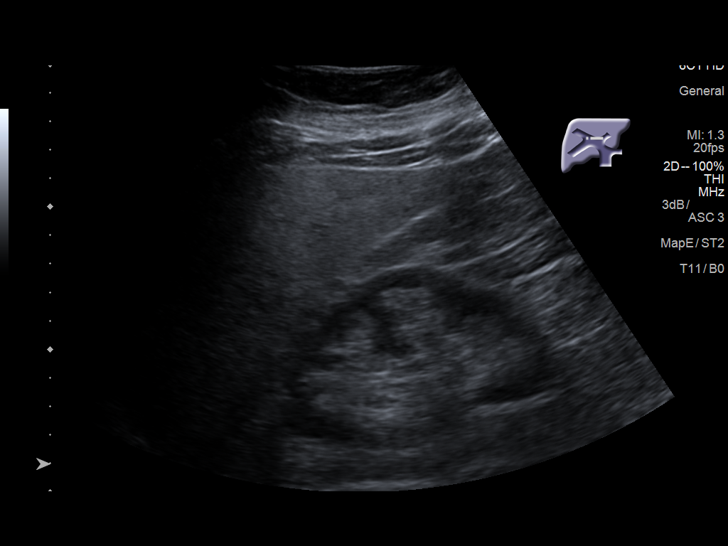
[im 35/76]
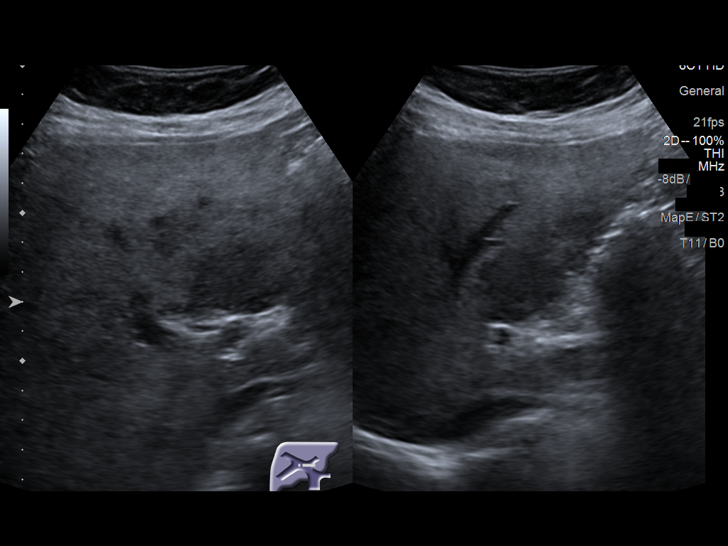
[im 41/76]
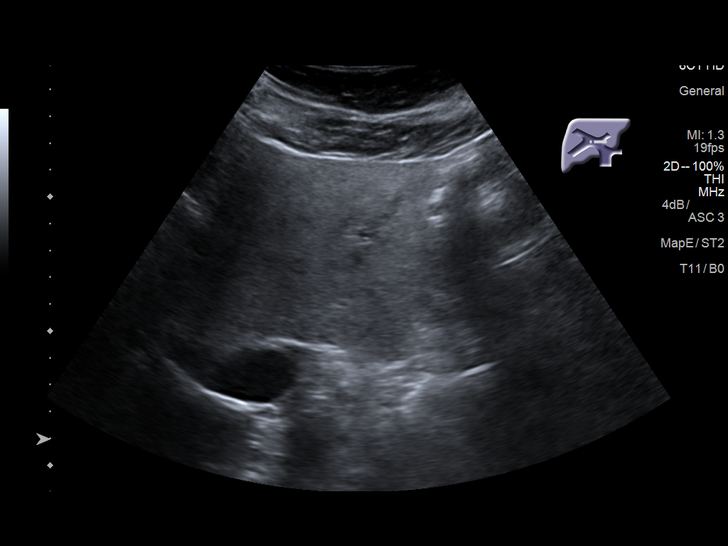
[im 47/76]
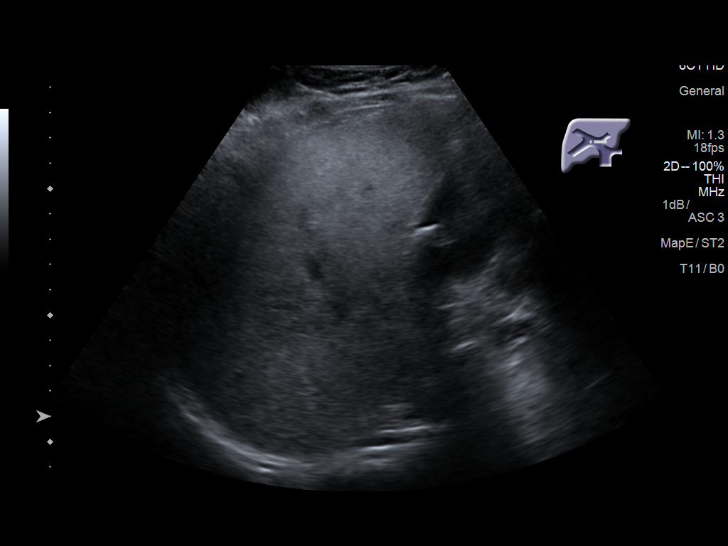
[im 51/76]
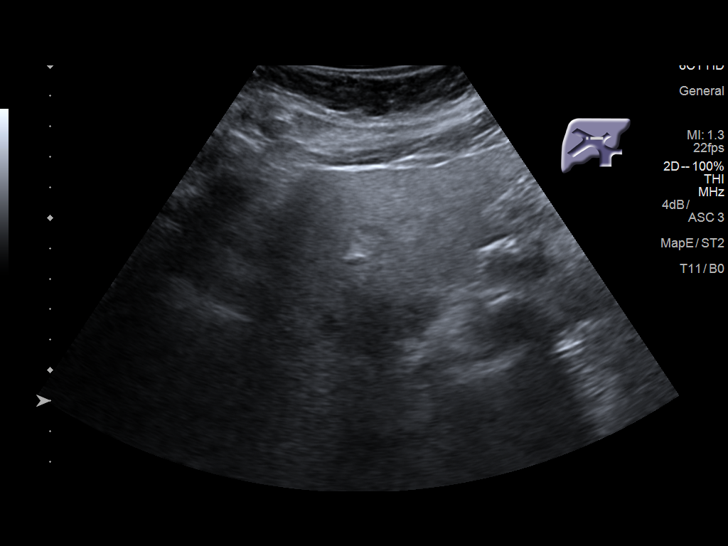
[im 57/76]
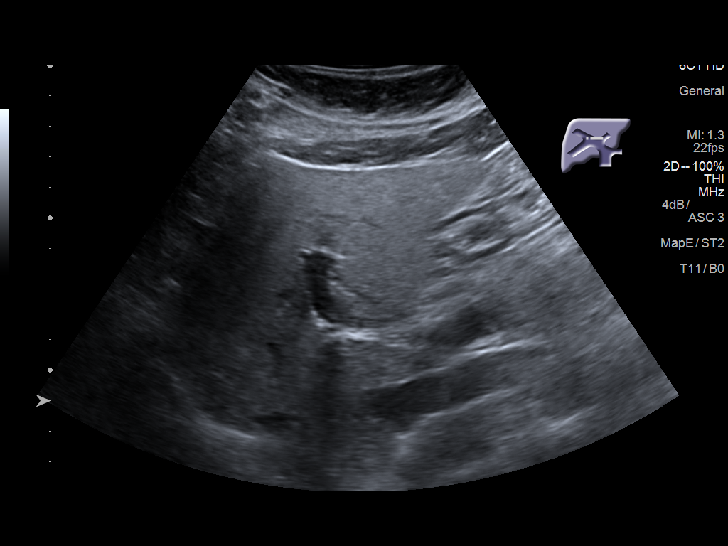
[im 63/76]
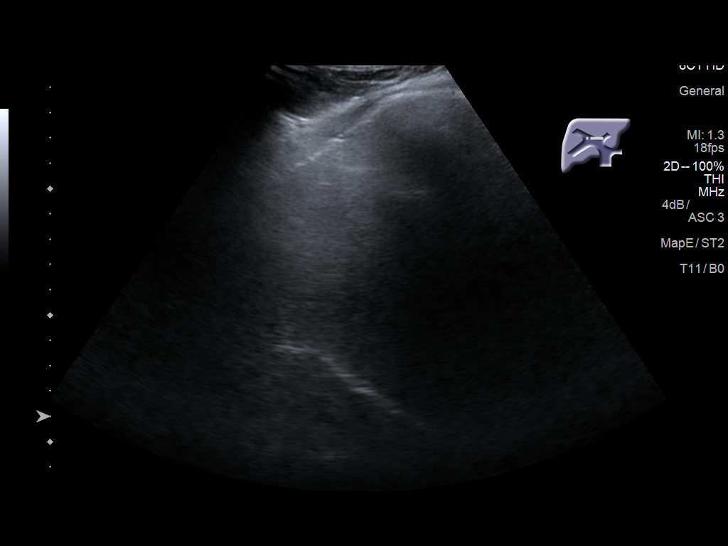
[im 69/76]
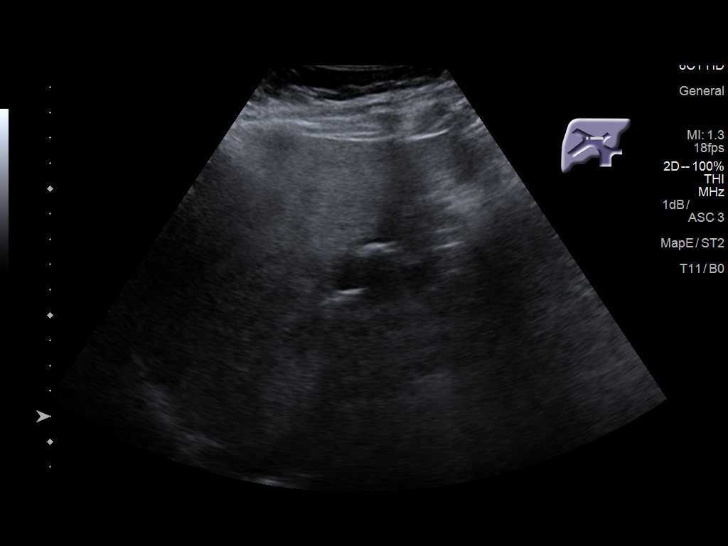
[im 76/76]
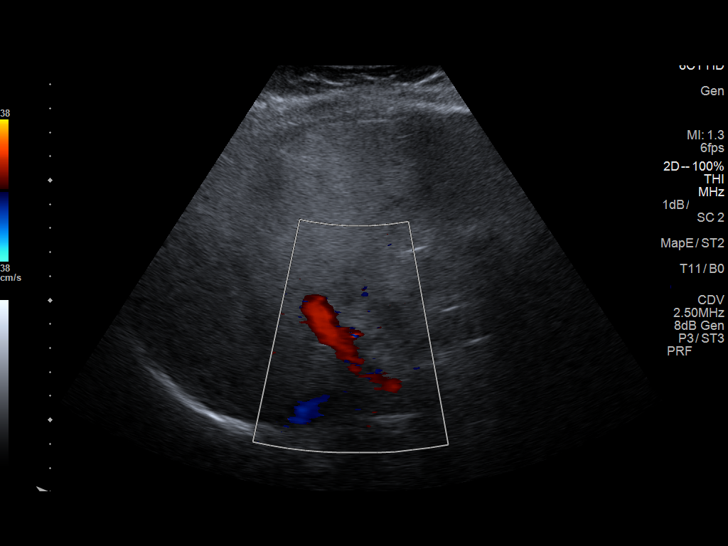

[14 of 25 positions shown; findings below may reference images not displayed]

FINDINGS: Gallbladder:

Gallstones noted with the largest measuring 1.8 cm. Gallbladder wall
thickness 1.9 mm. Negative Murphy sign.

Common bile duct:

Diameter: 3.0 mm

Liver:

Heterogeneous parenchymal pattern. 3.5 cm hypoechoic lesion is noted
in the central portion of the liver. This appears new from prior
exam. Contrast-enhanced MRI of the liver suggested for further
evaluation. Increased echogenicity of the liver consistent fatty
infiltration and/or hepatocellular disease again noted. Portal vein
is patent on color Doppler imaging with normal direction of blood
flow towards the liver.
IMPRESSION: 1. Multiple gallstones with the largest measuring 1.8 cm.
Gallbladder wall thickness normal. Negative Murphy sign. No biliary
distention.

2. 3.5 cm hypoechoic lesion noted in the central portion of the
liver. This is new from prior exam. Further evaluation with
contrast-enhanced MRI of the liver is suggested.

3. Increased echogenicity liver again noted consistent fatty
infiltration and/or hepatocellular disease

## 2020-09-26 ENCOUNTER — Encounter: Payer: PRIVATE HEALTH INSURANCE | Admitting: Nurse Practitioner

## 2020-10-16 ENCOUNTER — Other Ambulatory Visit: Payer: Self-pay | Admitting: *Deleted

## 2020-11-01 ENCOUNTER — Other Ambulatory Visit: Payer: Self-pay

## 2021-08-28 ENCOUNTER — Encounter: Payer: Self-pay | Admitting: Nurse Practitioner
